# Patient Record
Sex: Male | Born: 1985 | Race: Black or African American | Hispanic: No | Marital: Single | Smoking: Never smoker
Health system: Southern US, Community
[De-identification: ages and names within clinical notes are randomized; demographics above are authoritative.]

## PROBLEM LIST (undated history)

## (undated) DIAGNOSIS — M1990 Unspecified osteoarthritis, unspecified site: Secondary | ICD-10-CM

---
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
## 2003-10-02 ENCOUNTER — Emergency Department (HOSPITAL_COMMUNITY): Admission: EM | Admit: 2003-10-02 | Discharge: 2003-10-02 | Payer: Self-pay | Admitting: Emergency Medicine

## 2003-10-08 ENCOUNTER — Emergency Department (HOSPITAL_COMMUNITY): Admission: EM | Admit: 2003-10-08 | Discharge: 2003-10-08 | Payer: Self-pay | Admitting: Emergency Medicine

## 2004-03-19 ENCOUNTER — Emergency Department (HOSPITAL_COMMUNITY): Admission: EM | Admit: 2004-03-19 | Discharge: 2004-03-19 | Payer: Self-pay | Admitting: *Deleted

## 2009-05-24 ENCOUNTER — Emergency Department (HOSPITAL_COMMUNITY): Admission: EM | Admit: 2009-05-24 | Discharge: 2009-05-24 | Payer: Self-pay | Admitting: Emergency Medicine

## 2010-05-02 ENCOUNTER — Inpatient Hospital Stay (INDEPENDENT_AMBULATORY_CARE_PROVIDER_SITE_OTHER)
Admission: RE | Admit: 2010-05-02 | Discharge: 2010-05-02 | Disposition: A | Discharge status: Home / Self Care | Payer: Self-pay | Source: Ambulatory Visit | Attending: Family Medicine | Admitting: Family Medicine

## 2010-05-02 DIAGNOSIS — J45909 Unspecified asthma, uncomplicated: Secondary | ICD-10-CM

## 2011-06-05 ENCOUNTER — Encounter (HOSPITAL_COMMUNITY): Payer: Self-pay | Admitting: Emergency Medicine

## 2011-06-05 ENCOUNTER — Emergency Department (HOSPITAL_COMMUNITY)
Admission: EM | Admit: 2011-06-05 | Discharge: 2011-06-05 | Disposition: A | Discharge status: Home / Self Care | Payer: BC Managed Care – PPO | Attending: Emergency Medicine | Admitting: Emergency Medicine

## 2011-06-05 ENCOUNTER — Emergency Department (HOSPITAL_COMMUNITY): Payer: BC Managed Care – PPO

## 2011-06-05 DIAGNOSIS — J45901 Unspecified asthma with (acute) exacerbation: Secondary | ICD-10-CM | POA: Insufficient documentation

## 2011-06-05 MED ORDER — PREDNISONE 20 MG PO TABS
60.0000 mg | ORAL_TABLET | Freq: Once | ORAL | Status: AC
  Administered 2011-06-05: 60 mg via ORAL
  Filled 2011-06-05: qty 3

## 2011-06-05 MED ORDER — ALBUTEROL SULFATE HFA 108 (90 BASE) MCG/ACT IN AERS
2.0000 | INHALATION_SPRAY | RESPIRATORY_TRACT | Status: DC
  Administered 2011-06-05: 2 via RESPIRATORY_TRACT
  Filled 2011-06-05: qty 6.7

## 2011-06-05 MED ORDER — PREDNISONE 10 MG PO TABS: 60.0000 mg | ORAL_TABLET | Freq: Every day | ORAL | Status: DC

## 2011-06-05 MED ORDER — ALBUTEROL SULFATE (5 MG/ML) 0.5% IN NEBU
5.0000 mg | INHALATION_SOLUTION | Freq: Once | RESPIRATORY_TRACT | Status: AC
  Administered 2011-06-05: 5 mg via RESPIRATORY_TRACT
  Filled 2011-06-05 (×2): qty 1

## 2011-06-05 MED ORDER — ALBUTEROL SULFATE HFA 108 (90 BASE) MCG/ACT IN AERS: 2.0000 | INHALATION_SPRAY | RESPIRATORY_TRACT | Status: DC | PRN

## 2011-06-05 NOTE — ED Notes (Signed)
Was lifting heavy things at work on Thursday, felt something pull in left chest area, left arm, asthma has been acting up since.

## 2011-06-05 NOTE — Discharge Instructions (Signed)
Asthma, Acute Bronchospasm  Your exam shows you have asthma, or acute bronchospasm that acts like asthma. Bronchospasm means your air passages become narrowed. These conditions are due to inflammation and airway spasm that cause narrowing of the bronchial tubes in the lungs. This causes you to have wheezing and shortness of breath.  CAUSES    Respiratory infections and allergies most often bring on these attacks. Smoking, air pollution, cold air, emotional upsets, and vigorous exercise can also bring them on.    TREATMENT     Treatment is aimed at making the narrowed airways larger. Mild asthma/bronchospasm is usually controlled with inhaled medicines. Albuterol is a common medicine that you breathe in to open spastic or narrowed airways. Some trade names for albuterol are Ventolin or Proventil. Steroid medicine is also used to reduce the inflammation when an attack is moderate or severe. Antibiotics (medications used to kill germs) are only used if a bacterial infection is present.    If you are pregnant and need to use Albuterol (Ventolin or Proventil), you can expect the baby to move more than usual shortly after the medicine is used.   HOME CARE INSTRUCTIONS     Rest.    Drink plenty of liquids. This helps the mucus to remain thin and easily coughed up. Do not use caffeine or alcohol.    Do not smoke. Avoid being exposed to second-hand smoke.    You play a critical role in keeping yourself in good health. Avoid exposure to things that cause you to wheeze. Avoid exposure to things that cause you to have breathing problems. Keep your medications up-to-date and available. Carefully follow your doctor's treatment plan.    When pollen or pollution is bad, keep windows closed and use an air conditioner go to places with air conditioning. If you are allergic to furry pets or birds, find new homes for them or keep them outside.    Take your medicine exactly as prescribed.     Asthma requires careful medical attention. See your caregiver for follow-up as advised. If you are more than [redacted] weeks pregnant and you were prescribed any new medications, let your Obstetrician know about the visit and how you are doing. Arrange a recheck.   SEEK IMMEDIATE MEDICAL CARE IF:     You are getting worse.    You have trouble breathing. If severe, call 911.    You develop chest pain or discomfort.    You are throwing up or not drinking fluids.    You are not getting better within 24 hours.    You are coughing up yellow, green, brown, or bloody sputum.    You develop a fever over 102 F (38.9 C).    You have trouble swallowing.   MAKE SURE YOU:     Understand these instructions.    Will watch your condition.    Will get help right away if you are not doing well or get worse.   Document Released: 05/01/2006 Document Revised: 01/03/2011 Document Reviewed: 12/29/2006  [employer] Patient Information 2012 [employer].
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
## 2018-05-13 ENCOUNTER — Other Ambulatory Visit: Payer: Self-pay

## 2018-05-13 ENCOUNTER — Emergency Department (HOSPITAL_COMMUNITY)
Admission: EM | Admit: 2018-05-13 | Discharge: 2018-05-13 | Disposition: A | Discharge status: Home / Self Care | Payer: Managed Care, Other (non HMO) | Attending: Emergency Medicine | Admitting: Emergency Medicine

## 2018-05-13 ENCOUNTER — Encounter (HOSPITAL_COMMUNITY): Payer: Self-pay | Admitting: Emergency Medicine

## 2018-05-13 DIAGNOSIS — J029 Acute pharyngitis, unspecified: Secondary | ICD-10-CM

## 2018-05-13 DIAGNOSIS — J45909 Unspecified asthma, uncomplicated: Secondary | ICD-10-CM | POA: Diagnosis not present

## 2018-05-13 DIAGNOSIS — Z79899 Other long term (current) drug therapy: Secondary | ICD-10-CM | POA: Insufficient documentation

## 2018-05-13 LAB — GROUP A STREP BY PCR: Group A Strep by PCR: NOT DETECTED

## 2018-05-13 MED ORDER — ACETAMINOPHEN 325 MG PO TABS
650.0000 mg | ORAL_TABLET | Freq: Once | ORAL | Status: AC
  Administered 2018-05-13: 650 mg via ORAL
  Filled 2018-05-13: qty 2

## 2018-05-13 MED ORDER — LIDOCAINE VISCOUS HCL 2 % MT SOLN
15.0000 mL | Freq: Once | OROMUCOSAL | Status: AC
  Administered 2018-05-13: 15 mL via OROMUCOSAL
  Filled 2018-05-13: qty 15

## 2018-05-13 MED ORDER — LIDOCAINE VISCOUS HCL 2 % MT SOLN: 15.0000 mL | OROMUCOSAL | 0 refills | Status: DC | PRN

## 2018-05-13 NOTE — Discharge Instructions (Signed)
Take viscous lidocaine as needed for sore throat.  Take over the counter tylenol as needed for headache and pain.  Return if you have any concerns. Your blood pressure is high today, please have it recheck by your doctor at your earliest convenient.

## 2018-05-13 NOTE — ED Provider Notes (Signed)
[facility]-EMERGENCY DEPT Provider Note   CSN: [id number] Arrival date & time: 05/13/18  8413    History   Chief Complaint Chief Complaint  Patient presents with  . Sore Throat    HPI Jason Owens is a 33 y.o. male.     The history is provided by the patient. No language interpreter was used.  Sore Throat      33 year old male with history of asthma presenting for evaluation of sore throat.  Patient report for the past 3 days he has had progressive worsening sore throat.  Rates pain as 8 out of 10, increasing pain with swallowing and also having a mild temporal headache along with pain in his neck.  He felt symptom started shortly after he was laughing while eating a piece of cake he may have choked on that.  He denies any associated fever or chills, no runny nose sneezing coughing chest pain or shortness of breath.  He denies any body aches.  He does report decrease in sensation of taste.  He denies any recent sick contact with anyone with COVID-19.  He did tried to drink tea, takes Halls, and cold medication without adequate relief.  Past Medical History:  Diagnosis Date  . Asthma     There are no active problems to display for this patient.   History reviewed. No pertinent surgical history.      Home Medications    Prior to Admission medications   Medication Sig Start Date End Date Taking? Authorizing Provider  albuterol (PROVENTIL HFA;VENTOLIN HFA) 108 (90 BASE) MCG/ACT inhaler Inhale 2 puffs into the lungs every 6 (six) hours as needed. For shortness of breath.    [provider]  albuterol (PROVENTIL HFA;VENTOLIN HFA) 108 (90 BASE) MCG/ACT inhaler Inhale 2 puffs into the lungs every 4 (four) hours as needed for wheezing. 06/05/11 06/04/12  Azalia Bilis, MD  FLOVENT HFA 110 MCG/ACT inhaler Inhale 1 puff into the lungs 2 (two) times daily. 05/04/18   [provider]  Fluticasone-Salmeterol (ADVAIR DISKUS IN) Inhale 1 puff into  the lungs 2 (two) times daily.    [provider]  predniSONE (DELTASONE) 10 MG tablet Take 6 tablets (60 mg total) by mouth daily. Patient not taking: Reported on 05/13/2018 06/05/11   Azalia Bilis, MD    Family History No family history on file.  Social History Social History   Tobacco Use  . Smoking status: Never Smoker  . Smokeless tobacco: Never Used  Substance Use Topics  . Alcohol use: Yes  . Drug use: Not on file     Allergies   Penicillins   Review of Systems Review of Systems  Constitutional: Negative for fever.  HENT: Positive for sore throat. Negative for trouble swallowing.      Physical Exam Updated Vital Signs BP (!) 151/93 (BP Location: Right Arm)   Pulse (!) 109   Temp 99 F (37.2 C) (Oral)   Resp 19   SpO2 97%   Physical Exam Vitals signs and nursing note reviewed.  Constitutional:      General: He is not in acute distress.    Appearance: He is well-developed.  HENT:     Head: Atraumatic.     Right Ear: Tympanic membrane normal.     Left Ear: Tympanic membrane normal.     Nose: No congestion or rhinorrhea.     Mouth/Throat:     Comments: Throat is difficult to examine however no obvious signs of peritonsillar abscess  and no trismus. Eyes:     Conjunctiva/sclera: Conjunctivae normal.  Neck:     Musculoskeletal: Normal range of motion and neck supple.     Thyroid: No thyromegaly.  Cardiovascular:     Rate and Rhythm: Normal rate.  Pulmonary:     Breath sounds: No wheezing.  Abdominal:     Palpations: Abdomen is soft.  Lymphadenopathy:     Cervical: No cervical adenopathy.  Skin:    Findings: No rash.  Neurological:     Mental Status: He is alert and oriented to person, place, and time.      ED Treatments / Results  Labs (all labs ordered are listed, but only abnormal results are displayed) Labs Reviewed  GROUP A STREP BY PCR    EKG None  Radiology No results found.  Procedures Procedures (including critical  care time)  Medications Ordered in ED Medications  acetaminophen (TYLENOL) tablet 650 mg (650 mg Oral Given 05/13/18 0959)  lidocaine (XYLOCAINE) 2 % viscous mouth solution 15 mL (15 mLs Mouth/Throat Given 05/13/18 0959)     Initial Impression / Assessment and Plan / ED Course  I have reviewed the triage vital signs and the nursing notes.  Pertinent labs & imaging results that were available during my care of the patient were reviewed by me and considered in my medical decision making (see chart for details).        BP (!) 151/93 (BP Location: Right Arm)   Pulse (!) 109   Temp 99 F (37.2 C) (Oral)   Resp 19   SpO2 97%    Final Clinical Impressions(s) / ED Diagnoses   Final diagnoses:  Sore throat    ED Discharge Orders         Ordered    lidocaine (XYLOCAINE) 2 % solution  As needed     05/13/18 0952         9:09 AM Patient here with sore throat.  This started shortly after he ate a piece of cake, and may have choked on that.  He is currently in no respiratory discomfort, able to speak in complete sentences, no hoarseness.  Throat examination is limited due to his large body habitus but no obvious signs of deep tissue infection on exam.  Neck is supple.  I also have low suspicion for COVID-19.  Will obtain rapid strep test to rule out bacterial infection.  Patient given Tylenol for headache and viscous lidocaine for throat irritation.  9:50 AM Pt's strep test negative.  Pt given viscous lidocaine for symptomatic treatment.  Low suspicion for esophageal stricture or obstruction.     Fayrene Helperran, Khush Pasion, PA-C 05/14/18 1512    Terrilee FilesButler, Michael C, MD 05/15/18 [phone]

## 2018-05-13 NOTE — ED Triage Notes (Signed)
Pt reports yesterday was eating cake and laughed and got choked on it. Reports had sore throat since. Pt adds headache

## 2018-05-13 NOTE — ED Notes (Signed)
ED Provider at bedside. 

## 2018-06-22 ENCOUNTER — Emergency Department (HOSPITAL_COMMUNITY): Payer: Managed Care, Other (non HMO)

## 2018-06-22 ENCOUNTER — Emergency Department (HOSPITAL_COMMUNITY)
Admission: EM | Admit: 2018-06-22 | Discharge: 2018-06-23 | Disposition: A | Discharge status: Home / Self Care | Payer: Managed Care, Other (non HMO) | Attending: Emergency Medicine | Admitting: Emergency Medicine

## 2018-06-22 ENCOUNTER — Other Ambulatory Visit: Payer: Self-pay

## 2018-06-22 ENCOUNTER — Encounter (HOSPITAL_COMMUNITY): Payer: Self-pay

## 2018-06-22 DIAGNOSIS — M25561 Pain in right knee: Secondary | ICD-10-CM | POA: Diagnosis present

## 2018-06-22 DIAGNOSIS — M25562 Pain in left knee: Secondary | ICD-10-CM

## 2018-06-22 DIAGNOSIS — Z79899 Other long term (current) drug therapy: Secondary | ICD-10-CM | POA: Insufficient documentation

## 2018-06-22 DIAGNOSIS — M170 Bilateral primary osteoarthritis of knee: Secondary | ICD-10-CM | POA: Insufficient documentation

## 2018-06-22 DIAGNOSIS — J45909 Unspecified asthma, uncomplicated: Secondary | ICD-10-CM | POA: Diagnosis not present

## 2018-06-22 MED ORDER — PREDNISONE 20 MG PO TABS: 20.0000 mg | ORAL_TABLET | Freq: Two times a day (BID) | ORAL | 0 refills | Status: DC

## 2018-06-22 MED ORDER — HYDROCODONE-ACETAMINOPHEN 5-325 MG PO TABS: 2.0000 | ORAL_TABLET | ORAL | 0 refills | Status: DC | PRN

## 2018-06-22 MED ORDER — HYDROCODONE-ACETAMINOPHEN 5-325 MG PO TABS
1.0000 | ORAL_TABLET | Freq: Once | ORAL | Status: AC
  Administered 2018-06-23: 1 via ORAL
  Filled 2018-06-22: qty 1

## 2018-06-22 MED ORDER — PREDNISONE 20 MG PO TABS
60.0000 mg | ORAL_TABLET | Freq: Once | ORAL | Status: AC
  Administered 2018-06-23: 60 mg via ORAL
  Filled 2018-06-22: qty 3

## 2018-06-22 NOTE — ED Notes (Signed)
Pt ambulatory.

## 2018-06-22 NOTE — ED Notes (Signed)
Bed: WTR5 Expected date:  Expected time:  Means of arrival:  Comments: 

## 2018-06-22 NOTE — ED Triage Notes (Signed)
Pt reports bilateral knee pain x2 weeks. Pt was given anti-inflammatory but reports it has not helped.

## 2018-06-22 NOTE — Discharge Instructions (Addendum)
The pain in your knees is likely due to arthritis.  To treat this discomfort we are prescribing prednisone and a pain reliever.  Do not take the narcotic pain reliever within 6 hours of driving or working.

## 2018-06-22 NOTE — ED Provider Notes (Signed)
[facility]-EMERGENCY DEPT Provider Note   CSN: [id number] Arrival date & time: 06/22/18  2104    History   Chief Complaint Chief Complaint  Patient presents with  . Knee Pain    HPI Jason Owens is a 33 y.o. male.     HPI   He presents for evaluation of right and left knee pain present for several weeks, without specific trauma.  He works a job at [employer], currently, and is on his feet.  His pain is worse when he attempts to get out of bed in the morning.  He tried an unknown type of anti-inflammatory medicine prescribed by Dr. from a clinic, 2 weeks ago.  He has had no relief from that medication.  No longstanding leg problems.  There are no other known modifying factors.  Past Medical History:  Diagnosis Date  . Asthma     There are no active problems to display for this patient.   History reviewed. No pertinent surgical history.      Home Medications    Prior to Admission medications   Medication Sig Start Date End Date Taking? Authorizing Provider  albuterol (PROVENTIL HFA;VENTOLIN HFA) 108 (90 BASE) MCG/ACT inhaler Inhale 2 puffs into the lungs every 6 (six) hours as needed. For shortness of breath.    [provider]  albuterol (PROVENTIL HFA;VENTOLIN HFA) 108 (90 BASE) MCG/ACT inhaler Inhale 2 puffs into the lungs every 4 (four) hours as needed for wheezing. 06/05/11 06/04/12  Azalia Bilis, MD  FLOVENT HFA 110 MCG/ACT inhaler Inhale 1 puff into the lungs 2 (two) times daily. 05/04/18   [provider]  Fluticasone-Salmeterol (ADVAIR DISKUS IN) Inhale 1 puff into the lungs 2 (two) times daily.    [provider]  HYDROcodone-acetaminophen (NORCO) 5-325 MG tablet Take 2 tablets by mouth every 4 (four) hours as needed for moderate pain. 06/22/18   Mancel Bale, MD  lidocaine (XYLOCAINE) 2 % solution Use as directed 15 mLs in the mouth or throat as needed for mouth pain (sore throat). 05/13/18   Fayrene Helper, PA-C   predniSONE (DELTASONE) 20 MG tablet Take 1 tablet (20 mg total) by mouth 2 (two) times daily. 06/22/18   Mancel Bale, MD    Family History No family history on file.  Social History Social History   Tobacco Use  . Smoking status: Never Smoker  . Smokeless tobacco: Never Used  Substance Use Topics  . Alcohol use: Yes  . Drug use: Not on file     Allergies   Penicillins   Review of Systems Review of Systems  All other systems reviewed and are negative.    Physical Exam Updated Vital Signs BP (!) 146/88 (BP Location: Left Arm)   Pulse 98   Temp 98.7 F (37.1 C) (Oral)   Resp 20   Ht 5\' 11"  (1.803 m)   Wt (!) 152 kg   SpO2 93%   BMI 46.74 kg/m   Physical Exam Vitals signs and nursing note reviewed.  Constitutional:      General: He is not in acute distress.    Appearance: He is well-developed. He is obese. He is not ill-appearing, toxic-appearing or diaphoretic.  HENT:     Head: Normocephalic and atraumatic.     Right Ear: External ear normal.     Left Ear: External ear normal.  Eyes:     Conjunctiva/sclera: Conjunctivae normal.     Pupils: Pupils are equal, round, and reactive to light.  Neck:     Musculoskeletal: Normal range of motion and neck supple.     Trachea: Phonation normal.  Cardiovascular:     Rate and Rhythm: Normal rate.  Pulmonary:     Effort: Pulmonary effort is normal.  Musculoskeletal: Normal range of motion.     Comments: Knees with normal range of motion bilaterally.  No areas of focalized tenderness.  Ambulation normal.  Skin:    General: Skin is warm and dry.  Neurological:     Mental Status: He is alert and oriented to person, place, and time.     Cranial Nerves: No cranial nerve deficit.     Sensory: No sensory deficit.     Motor: No abnormal muscle tone.     Coordination: Coordination normal.  Psychiatric:        Mood and Affect: Mood normal.        Behavior: Behavior normal.        Thought Content: Thought content  normal.        Judgment: Judgment normal.      ED Treatments / Results  Labs (all labs ordered are listed, but only abnormal results are displayed) Labs Reviewed - No data to display  EKG None  Radiology Dg Knee Complete 4 Views Left  Result Date: 06/22/2018 CLINICAL DATA:  Left knee pain for 2 weeks, no known injury, initial encounter EXAM: LEFT KNEE - COMPLETE 4+ VIEW COMPARISON:  None. FINDINGS: Mild degenerative changes are noted without acute fracture or dislocation. No soft tissue abnormality is noted. IMPRESSION: Degenerative changes without acute abnormality. Electronically Signed   By: Alcide CleverMark  Lukens M.D.   On: 06/22/2018 22:51   Dg Knee Complete 4 Views Right  Result Date: 06/22/2018 CLINICAL DATA:  Right knee pain, no known injury, initial encounter EXAM: RIGHT KNEE - COMPLETE 4+ VIEW COMPARISON:  None. FINDINGS: No acute fracture or dislocation is seen. Mild degenerative changes of the knee joint are noted. Small joint effusion is seen. IMPRESSION: Degenerative change with small joint effusion. Electronically Signed   By: Alcide CleverMark  Lukens M.D.   On: 06/22/2018 22:51    Procedures Procedures (including critical care time)  Medications Ordered in ED Medications  predniSONE (DELTASONE) tablet 60 mg (has no administration in time range)  HYDROcodone-acetaminophen (NORCO/VICODIN) 5-325 MG per tablet 1 tablet (has no administration in time range)     Initial Impression / Assessment and Plan / ED Course  I have reviewed the triage vital signs and the nursing notes.  Pertinent labs & imaging results that were available during my care of the patient were reviewed by me and considered in my medical decision making (see chart for details).         Patient Vitals for the past 24 hrs:  BP Temp Temp src Pulse Resp SpO2 Height Weight  06/22/18 2146 - - - - - - 5\' 11"  (1.803 m) (!) 152 kg  06/22/18 2138 (!) 146/88 98.7 F (37.1 C) Oral 98 20 93 % 5\' 11"  (1.803 m) (!) 152.4 kg     11:49 PM Reevaluation with update and discussion. After initial assessment and treatment, an updated evaluation reveals no change in clinical status, findings discussed with the patient and all questions were answered. Mancel BaleElliott Johnie Stadel   Medical Decision Making: Knee pain likely due to arthritis given by obesity.  Mild degenerative change on imaging.  Doubt internal derangement.  Doubt lumbar radiculopathy.  CRITICAL CARE-no Performed by: Mancel BaleElliott Thelia Tanksley  Nursing Notes Reviewed/ Care Coordinated Applicable Imaging Reviewed  Interpretation of Laboratory Data incorporated into ED treatment  The patient appears reasonably screened and/or stabilized for discharge and I doubt any other medical condition or other [facility] requiring further screening, evaluation, or treatment in the ED at this time prior to discharge.  Plan: Home Medications-continue routine medications; Home Treatments-heat and cryotherapy.; return here if the recommended treatment, does not improve the symptoms; Recommended follow up-orthopedic follow-up as needed.   Final Clinical Impressions(s) / ED Diagnoses   Final diagnoses:  Acute pain of both knees  Osteoarthritis of both knees, unspecified osteoarthritis type    ED Discharge Orders         Ordered    predniSONE (DELTASONE) 20 MG tablet  2 times daily     06/22/18 2348    HYDROcodone-acetaminophen (NORCO) 5-325 MG tablet  Every 4 hours PRN     06/22/18 2348           Mancel Bale, MD 06/22/18 2350

## 2018-06-23 NOTE — ED Notes (Signed)
Pt reports that he is not driving home. States that his ride is waiting outside.

## 2020-01-09 IMAGING — CR LEFT KNEE - COMPLETE 4+ VIEW
4 series · 4 of 4 positions shown · non-contrast
Comparison: None.

CLINICAL DATA: Left knee pain for 2 weeks, no known injury, initial
encounter

EXAM:
LEFT KNEE - COMPLETE 4+ VIEW

[t knee ap left]
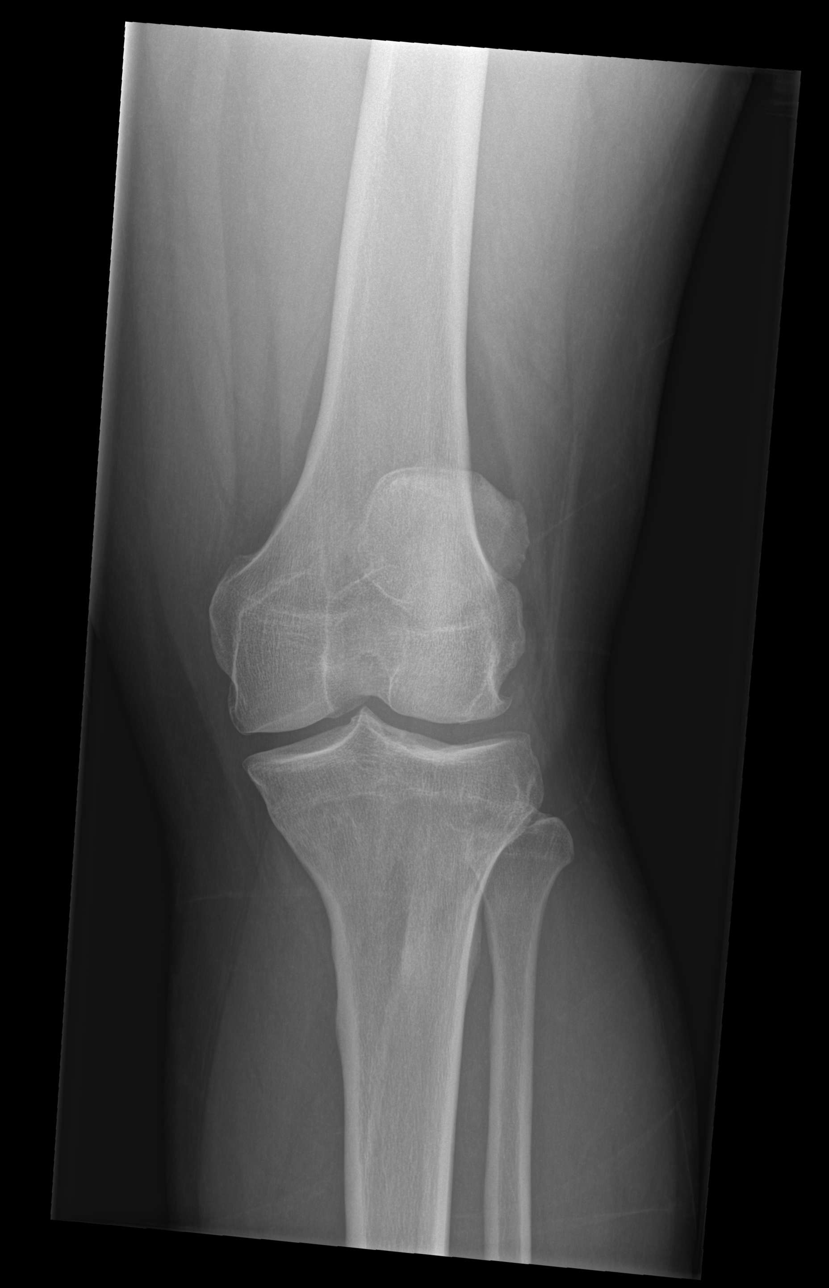

[t knee obl left (1 of 2)]
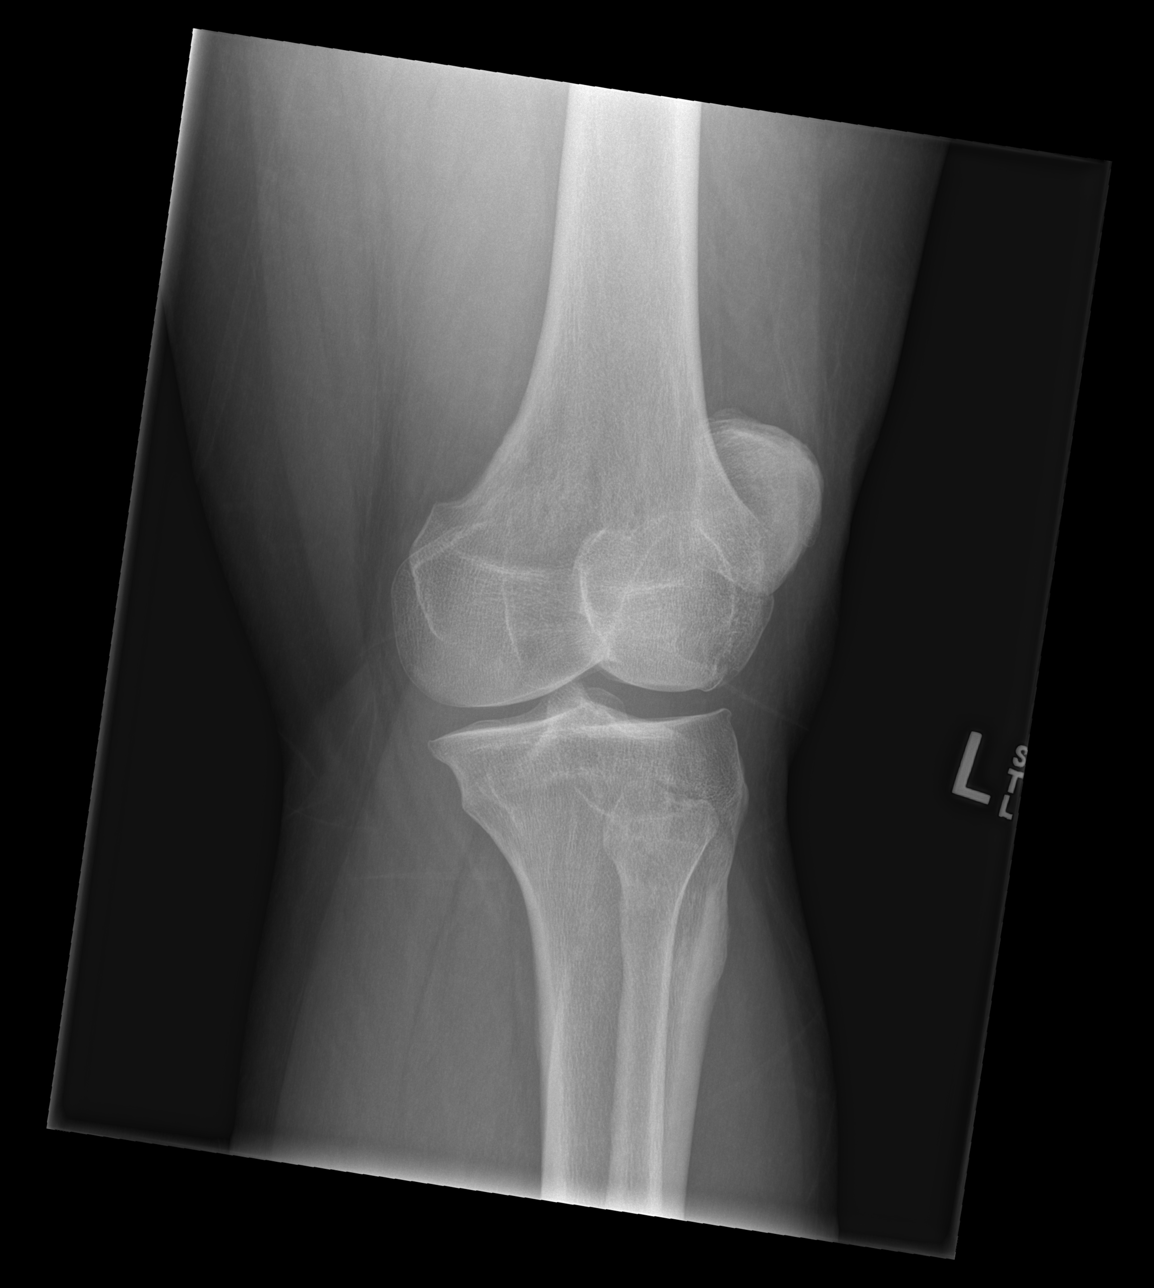

[t knee obl left (2 of 2)]
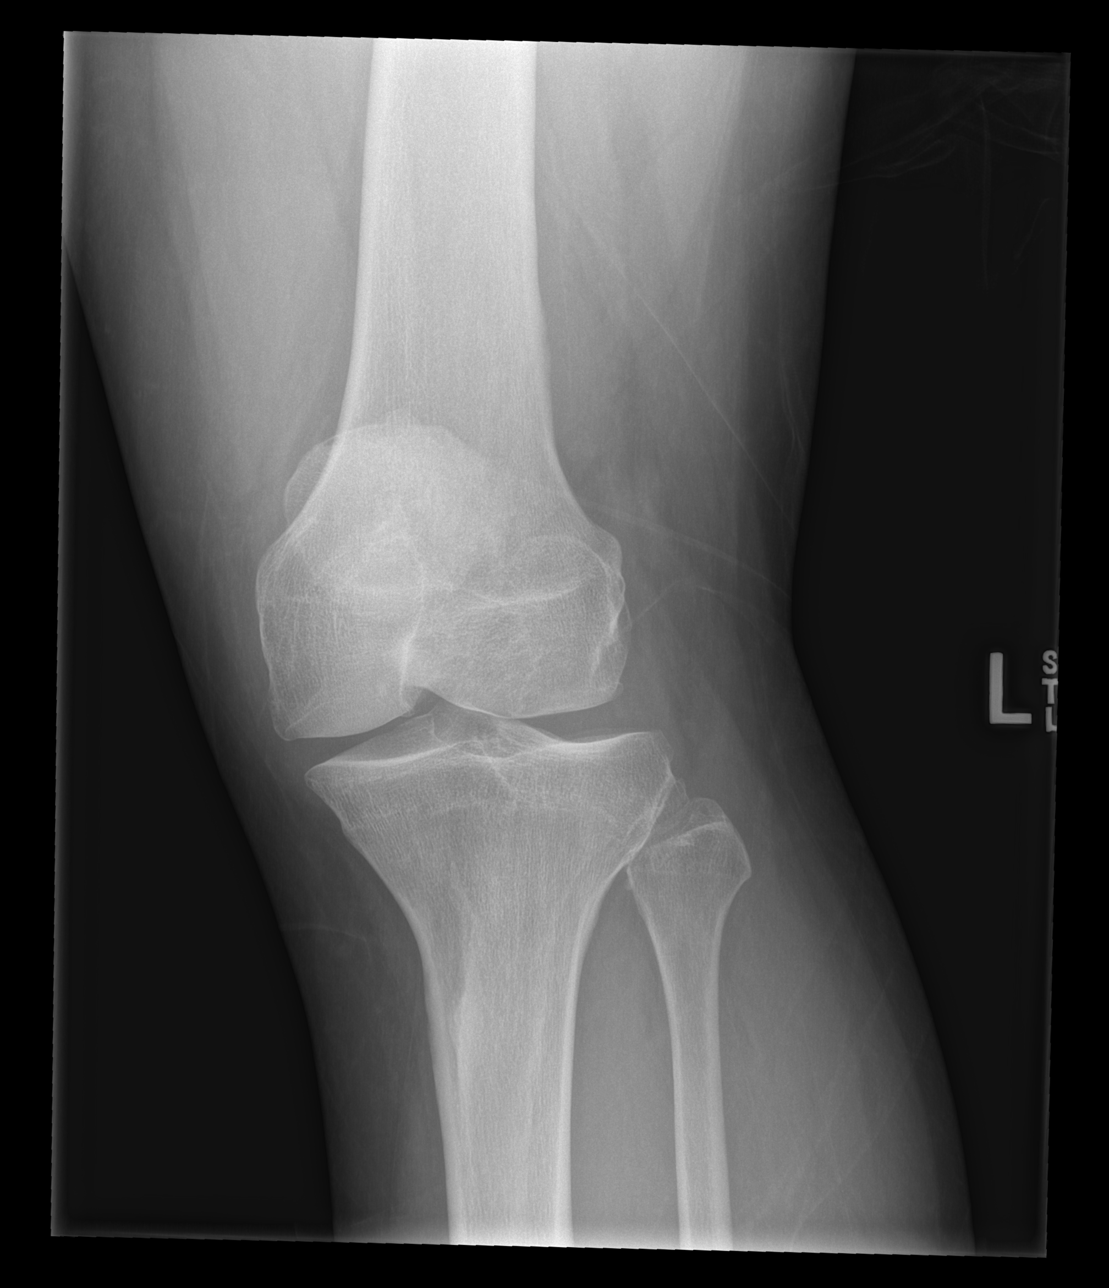

[t knee lat left]
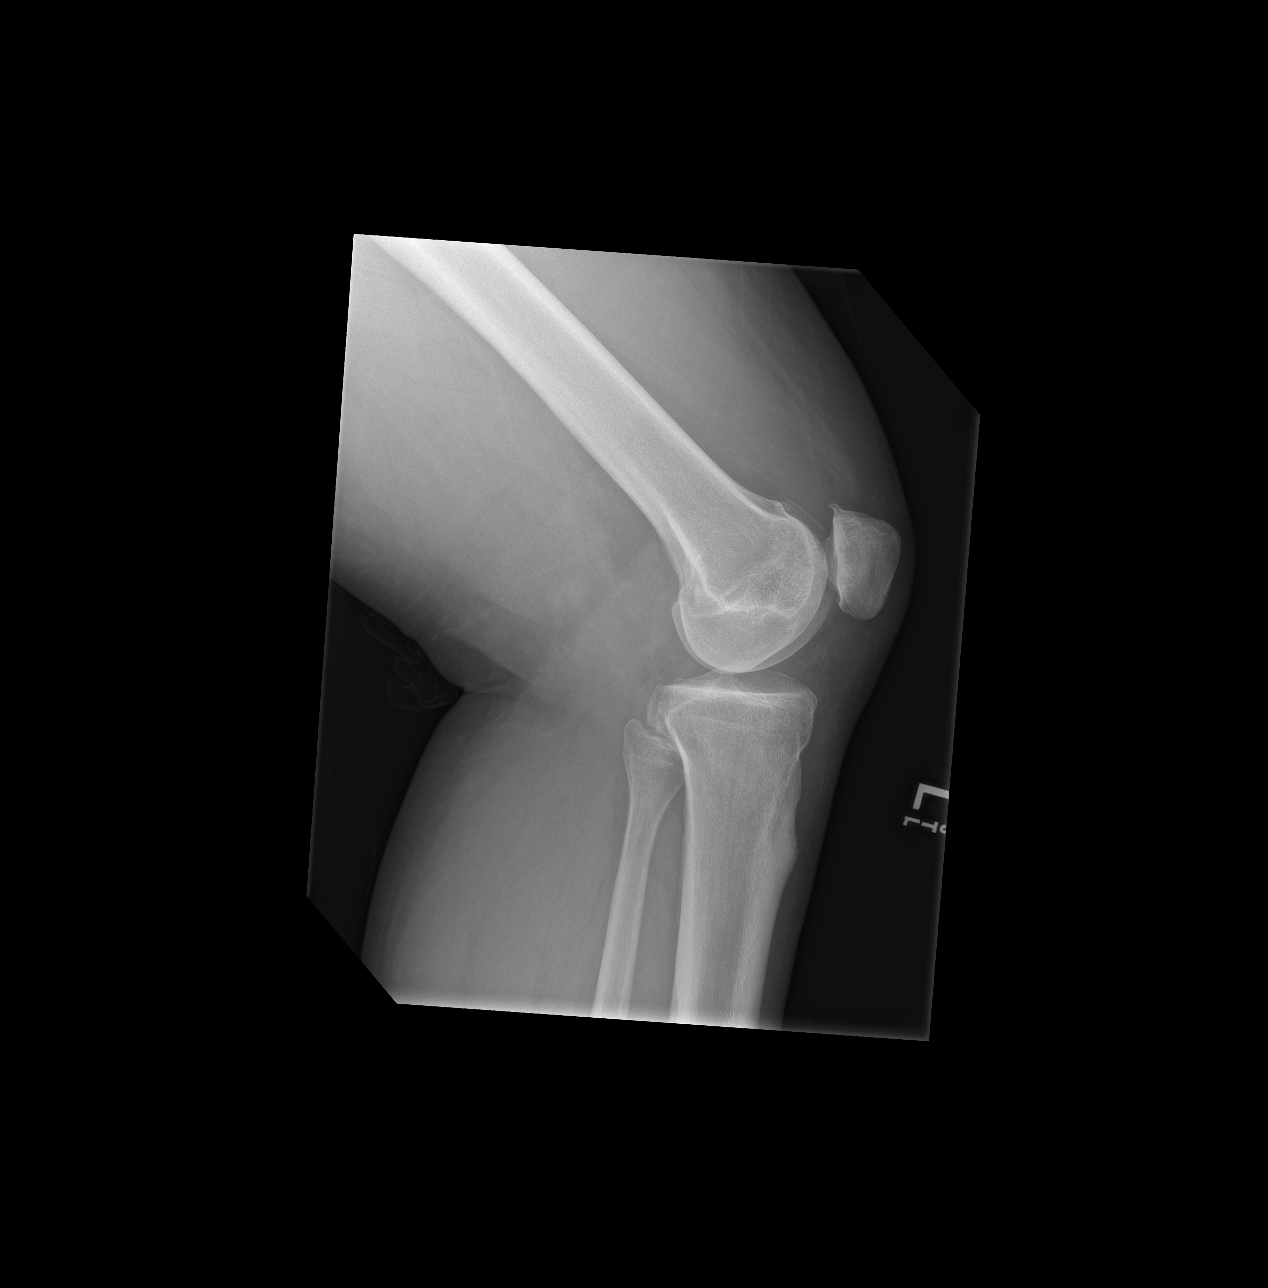

[4 of 4 positions shown; findings below may reference images not displayed]

FINDINGS: Mild degenerative changes are noted without acute fracture or
dislocation. No soft tissue abnormality is noted.
IMPRESSION: Degenerative changes without acute abnormality.

## 2020-01-09 IMAGING — CR RIGHT KNEE - COMPLETE 4+ VIEW
4 series · 4 of 4 positions shown · non-contrast
Comparison: None.

CLINICAL DATA: Right knee pain, no known injury, initial encounter

EXAM:
RIGHT KNEE - COMPLETE 4+ VIEW

[t knee ap right]
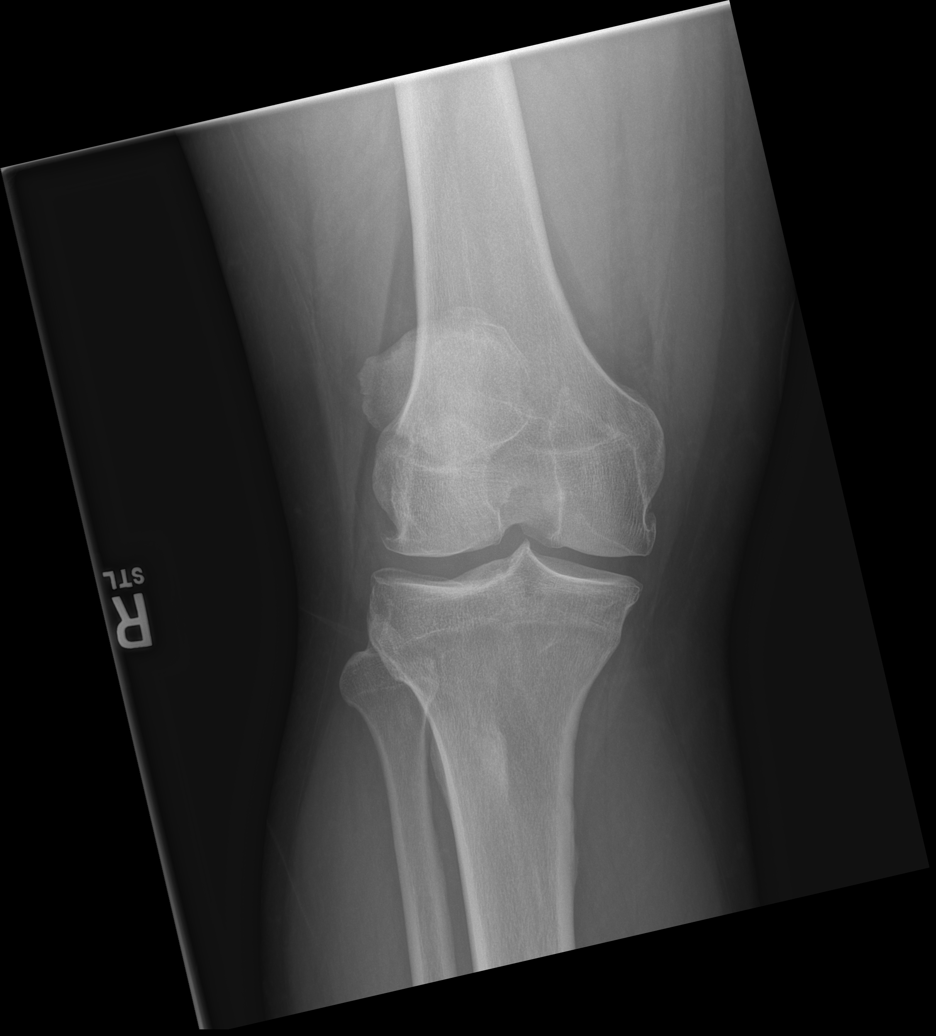

[t knee obl right (1 of 2)]
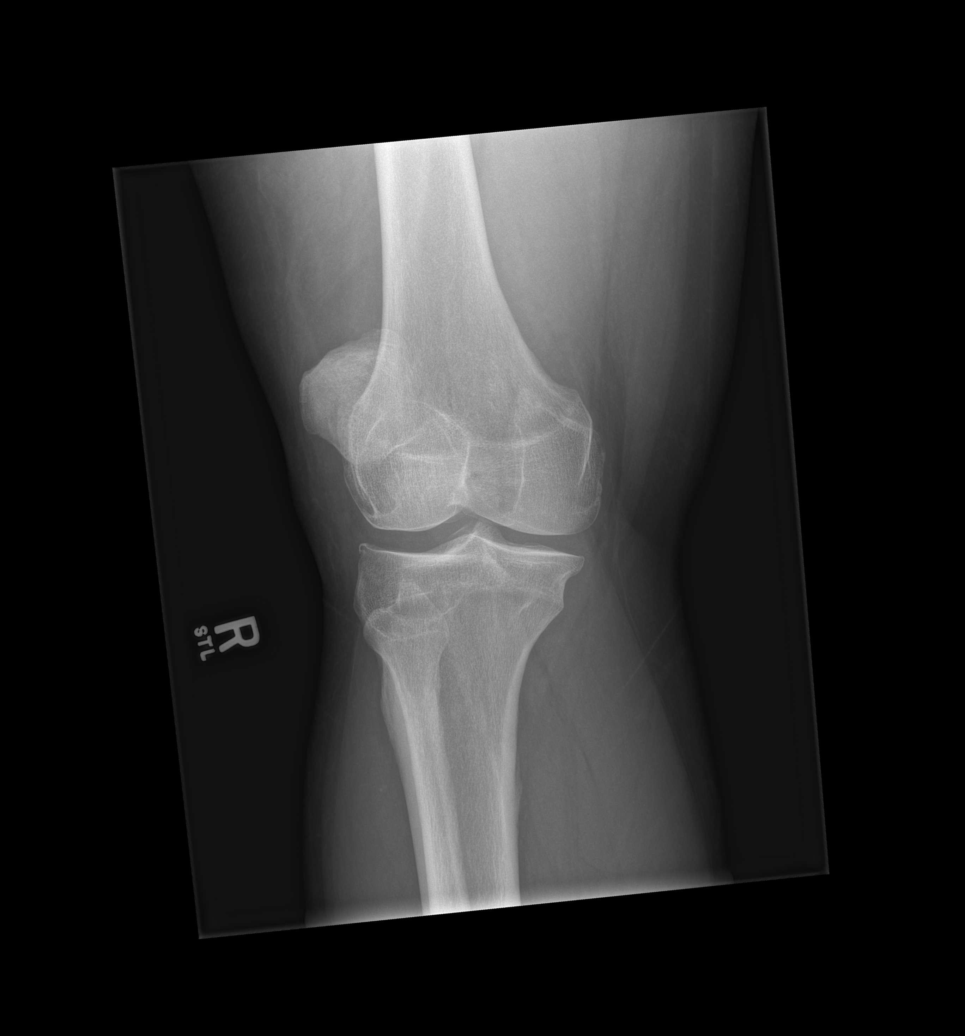

[t knee obl right (2 of 2)]
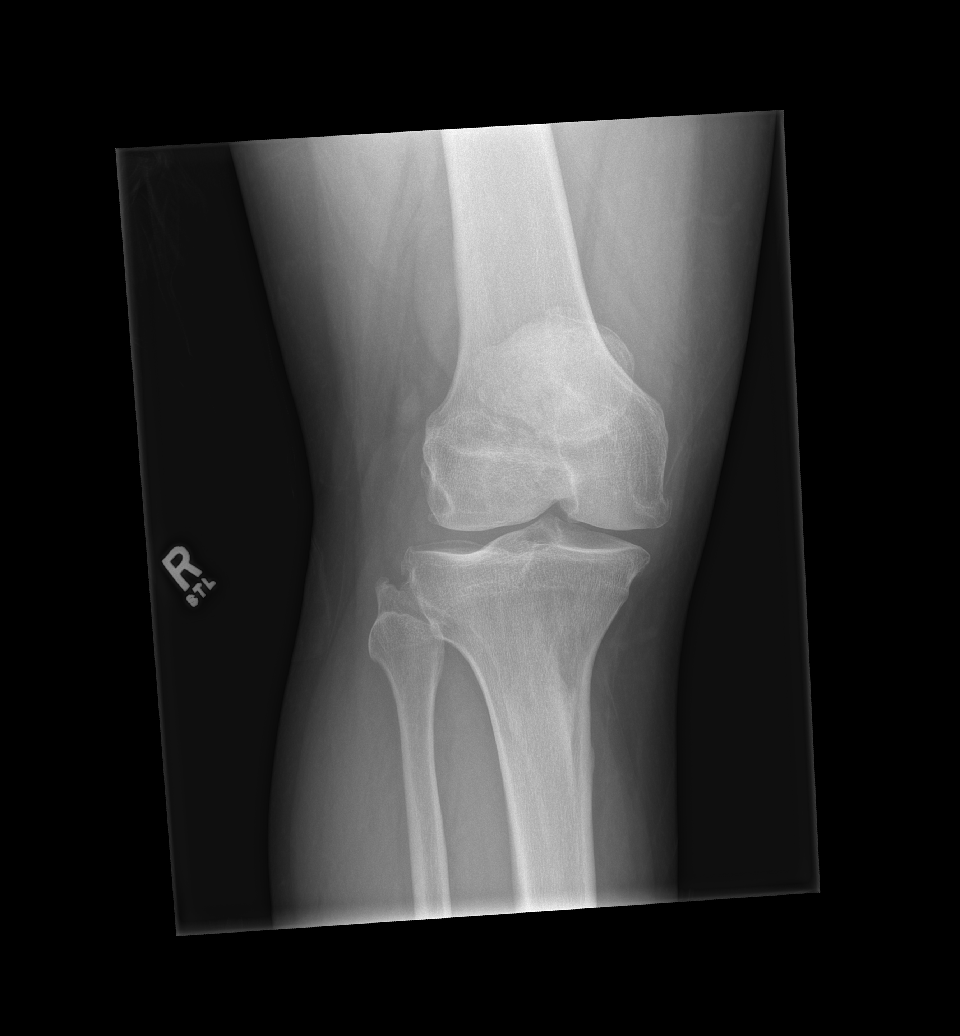

[t knee lat right]
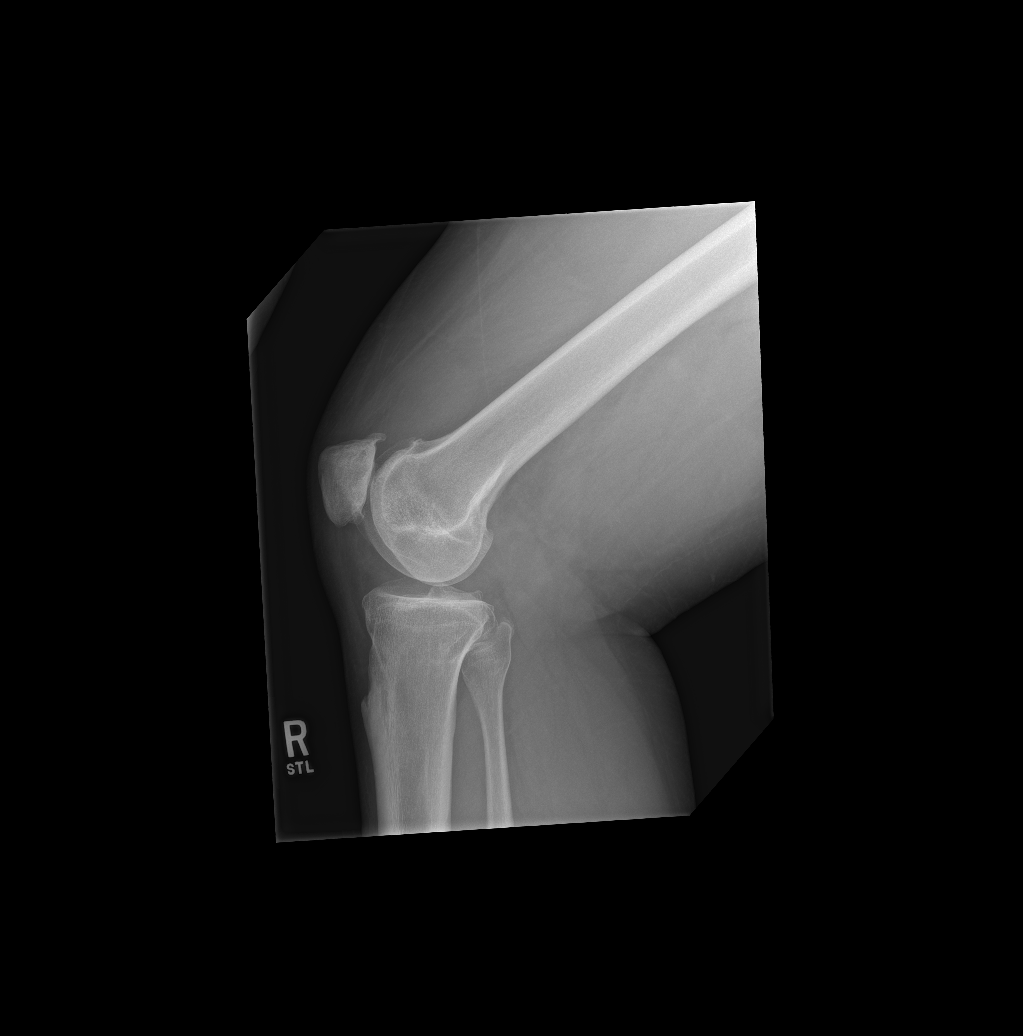

[4 of 4 positions shown; findings below may reference images not displayed]

FINDINGS: No acute fracture or dislocation is seen. Mild degenerative changes
of the knee joint are noted. Small joint effusion is seen.
IMPRESSION: Degenerative change with small joint effusion.

## 2020-01-19 DIAGNOSIS — M25561 Pain in right knee: Secondary | ICD-10-CM | POA: Diagnosis not present

## 2020-01-19 DIAGNOSIS — Z880 Allergy status to penicillin: Secondary | ICD-10-CM | POA: Diagnosis not present

## 2020-01-19 DIAGNOSIS — M25461 Effusion, right knee: Secondary | ICD-10-CM | POA: Diagnosis not present

## 2020-01-19 DIAGNOSIS — M1990 Unspecified osteoarthritis, unspecified site: Secondary | ICD-10-CM | POA: Diagnosis not present

## 2020-01-26 DIAGNOSIS — M25561 Pain in right knee: Secondary | ICD-10-CM | POA: Diagnosis not present

## 2020-01-26 DIAGNOSIS — Z23 Encounter for immunization: Secondary | ICD-10-CM | POA: Diagnosis not present

## 2020-02-15 DIAGNOSIS — Z87891 Personal history of nicotine dependence: Secondary | ICD-10-CM | POA: Diagnosis not present

## 2020-02-15 DIAGNOSIS — R918 Other nonspecific abnormal finding of lung field: Secondary | ICD-10-CM | POA: Diagnosis not present

## 2020-02-15 DIAGNOSIS — R059 Cough, unspecified: Secondary | ICD-10-CM | POA: Diagnosis not present

## 2020-02-15 DIAGNOSIS — R0602 Shortness of breath: Secondary | ICD-10-CM | POA: Diagnosis not present

## 2020-02-15 DIAGNOSIS — U071 COVID-19: Secondary | ICD-10-CM | POA: Diagnosis not present

## 2020-02-15 DIAGNOSIS — R110 Nausea: Secondary | ICD-10-CM | POA: Diagnosis not present

## 2020-02-15 DIAGNOSIS — J45909 Unspecified asthma, uncomplicated: Secondary | ICD-10-CM | POA: Diagnosis not present

## 2020-06-14 DIAGNOSIS — Z298 Encounter for other specified prophylactic measures: Secondary | ICD-10-CM | POA: Diagnosis not present

## 2020-06-14 DIAGNOSIS — Z412 Encounter for routine and ritual male circumcision: Secondary | ICD-10-CM | POA: Diagnosis not present

## 2020-07-22 DIAGNOSIS — Z6841 Body Mass Index (BMI) 40.0 and over, adult: Secondary | ICD-10-CM | POA: Diagnosis not present

## 2020-07-22 DIAGNOSIS — Z880 Allergy status to penicillin: Secondary | ICD-10-CM | POA: Diagnosis not present

## 2020-07-22 DIAGNOSIS — E669 Obesity, unspecified: Secondary | ICD-10-CM | POA: Diagnosis not present

## 2020-07-22 DIAGNOSIS — Z87891 Personal history of nicotine dependence: Secondary | ICD-10-CM | POA: Diagnosis not present

## 2020-07-22 DIAGNOSIS — N611 Abscess of the breast and nipple: Secondary | ICD-10-CM | POA: Diagnosis not present

## 2020-07-22 DIAGNOSIS — L538 Other specified erythematous conditions: Secondary | ICD-10-CM | POA: Diagnosis not present

## 2020-07-26 DIAGNOSIS — M1990 Unspecified osteoarthritis, unspecified site: Secondary | ICD-10-CM | POA: Diagnosis not present

## 2020-07-26 DIAGNOSIS — Z791 Long term (current) use of non-steroidal anti-inflammatories (NSAID): Secondary | ICD-10-CM | POA: Diagnosis not present

## 2020-07-26 DIAGNOSIS — Z87891 Personal history of nicotine dependence: Secondary | ICD-10-CM | POA: Diagnosis not present

## 2020-07-26 DIAGNOSIS — Z09 Encounter for follow-up examination after completed treatment for conditions other than malignant neoplasm: Secondary | ICD-10-CM | POA: Diagnosis not present

## 2020-07-26 DIAGNOSIS — Z880 Allergy status to penicillin: Secondary | ICD-10-CM | POA: Diagnosis not present

## 2020-07-26 DIAGNOSIS — Z79899 Other long term (current) drug therapy: Secondary | ICD-10-CM | POA: Diagnosis not present

## 2021-12-25 DIAGNOSIS — M25461 Effusion, right knee: Secondary | ICD-10-CM | POA: Diagnosis not present

## 2021-12-25 DIAGNOSIS — Z87891 Personal history of nicotine dependence: Secondary | ICD-10-CM | POA: Diagnosis not present

## 2021-12-25 DIAGNOSIS — M7989 Other specified soft tissue disorders: Secondary | ICD-10-CM | POA: Diagnosis not present

## 2021-12-25 DIAGNOSIS — Z880 Allergy status to penicillin: Secondary | ICD-10-CM | POA: Diagnosis not present

## 2021-12-25 DIAGNOSIS — M25562 Pain in left knee: Secondary | ICD-10-CM | POA: Diagnosis not present

## 2021-12-25 DIAGNOSIS — M1712 Unilateral primary osteoarthritis, left knee: Secondary | ICD-10-CM | POA: Diagnosis not present

## 2021-12-25 DIAGNOSIS — M25462 Effusion, left knee: Secondary | ICD-10-CM | POA: Diagnosis not present

## 2022-01-10 DIAGNOSIS — M2242 Chondromalacia patellae, left knee: Secondary | ICD-10-CM | POA: Diagnosis not present

## 2022-01-10 DIAGNOSIS — G8929 Other chronic pain: Secondary | ICD-10-CM | POA: Diagnosis not present

## 2022-01-10 DIAGNOSIS — M25562 Pain in left knee: Secondary | ICD-10-CM | POA: Diagnosis not present

## 2022-11-22 DIAGNOSIS — Z2989 Encounter for other specified prophylactic measures: Secondary | ICD-10-CM | POA: Diagnosis not present

## 2023-02-10 ENCOUNTER — Ambulatory Visit (HOSPITAL_COMMUNITY)
Admission: EM | Admit: 2023-02-10 | Discharge: 2023-02-10 | Disposition: A | Discharge status: Home / Self Care | Payer: 59 | Attending: Physician Assistant | Admitting: Physician Assistant

## 2023-02-10 ENCOUNTER — Inpatient Hospital Stay (HOSPITAL_COMMUNITY)
Admission: EM | Admit: 2023-02-10 | Discharge: 2023-02-13 | DRG: 189 | Disposition: A | Discharge status: Home / Self Care | Payer: 59 | Attending: Internal Medicine | Admitting: Internal Medicine

## 2023-02-10 ENCOUNTER — Encounter (HOSPITAL_COMMUNITY): Payer: Self-pay

## 2023-02-10 ENCOUNTER — Encounter (HOSPITAL_COMMUNITY): Payer: Self-pay | Admitting: Emergency Medicine

## 2023-02-10 ENCOUNTER — Other Ambulatory Visit: Payer: Self-pay

## 2023-02-10 ENCOUNTER — Emergency Department (HOSPITAL_COMMUNITY): Payer: Self-pay

## 2023-02-10 DIAGNOSIS — E875 Hyperkalemia: Secondary | ICD-10-CM | POA: Diagnosis not present

## 2023-02-10 DIAGNOSIS — J45901 Unspecified asthma with (acute) exacerbation: Principal | ICD-10-CM

## 2023-02-10 DIAGNOSIS — J9811 Atelectasis: Secondary | ICD-10-CM | POA: Diagnosis present

## 2023-02-10 DIAGNOSIS — Z6841 Body Mass Index (BMI) 40.0 and over, adult: Secondary | ICD-10-CM

## 2023-02-10 DIAGNOSIS — J9601 Acute respiratory failure with hypoxia: Secondary | ICD-10-CM

## 2023-02-10 DIAGNOSIS — E66813 Obesity, class 3: Secondary | ICD-10-CM | POA: Diagnosis not present

## 2023-02-10 DIAGNOSIS — T380X5A Adverse effect of glucocorticoids and synthetic analogues, initial encounter: Secondary | ICD-10-CM | POA: Diagnosis present

## 2023-02-10 DIAGNOSIS — J121 Respiratory syncytial virus pneumonia: Secondary | ICD-10-CM | POA: Diagnosis not present

## 2023-02-10 DIAGNOSIS — J4541 Moderate persistent asthma with (acute) exacerbation: Secondary | ICD-10-CM

## 2023-02-10 DIAGNOSIS — R079 Chest pain, unspecified: Secondary | ICD-10-CM | POA: Diagnosis not present

## 2023-02-10 DIAGNOSIS — B974 Respiratory syncytial virus as the cause of diseases classified elsewhere: Secondary | ICD-10-CM | POA: Diagnosis present

## 2023-02-10 DIAGNOSIS — G4733 Obstructive sleep apnea (adult) (pediatric): Secondary | ICD-10-CM | POA: Diagnosis not present

## 2023-02-10 DIAGNOSIS — Z23 Encounter for immunization: Secondary | ICD-10-CM | POA: Diagnosis not present

## 2023-02-10 DIAGNOSIS — Z7951 Long term (current) use of inhaled steroids: Secondary | ICD-10-CM

## 2023-02-10 DIAGNOSIS — R6510 Systemic inflammatory response syndrome (SIRS) of non-infectious origin without acute organ dysfunction: Secondary | ICD-10-CM | POA: Diagnosis not present

## 2023-02-10 DIAGNOSIS — R0789 Other chest pain: Secondary | ICD-10-CM | POA: Diagnosis not present

## 2023-02-10 DIAGNOSIS — R509 Fever, unspecified: Secondary | ICD-10-CM | POA: Diagnosis not present

## 2023-02-10 DIAGNOSIS — Z1152 Encounter for screening for COVID-19: Secondary | ICD-10-CM

## 2023-02-10 DIAGNOSIS — Z91199 Patient's noncompliance with other medical treatment and regimen due to unspecified reason: Secondary | ICD-10-CM

## 2023-02-10 DIAGNOSIS — R0902 Hypoxemia: Secondary | ICD-10-CM

## 2023-02-10 DIAGNOSIS — R0602 Shortness of breath: Secondary | ICD-10-CM | POA: Diagnosis not present

## 2023-02-10 HISTORY — DX: Unspecified osteoarthritis, unspecified site: M19.90

## 2023-02-10 LAB — CBC
HCT: 46.6 % (ref 39.0–52.0)
Hemoglobin: 15.3 g/dL (ref 13.0–17.0)
MCH: 29.8 pg (ref 26.0–34.0)
MCHC: 32.8 g/dL (ref 30.0–36.0)
MCV: 90.7 fL (ref 80.0–100.0)
Platelets: 255 10*3/uL (ref 150–400)
RBC: 5.14 MIL/uL (ref 4.22–5.81)
RDW: 13.2 % (ref 11.5–15.5)
WBC: 16.9 10*3/uL — ABNORMAL HIGH (ref 4.0–10.5)
nRBC: 0 % (ref 0.0–0.2)

## 2023-02-10 LAB — BASIC METABOLIC PANEL
Anion gap: 13 (ref 5–15)
BUN: 9 mg/dL (ref 6–20)
CO2: 24 mmol/L (ref 22–32)
Calcium: 9.7 mg/dL (ref 8.9–10.3)
Chloride: 99 mmol/L (ref 98–111)
Creatinine, Ser: 0.92 mg/dL (ref 0.61–1.24)
GFR, Estimated: >60 mL/min (ref >60)
Glucose, Bld: 120 mg/dL — ABNORMAL HIGH (ref 70–99)
Potassium: 4.5 mmol/L (ref 3.5–5.1)
Sodium: 136 mmol/L (ref 135–145)

## 2023-02-10 LAB — RESP PANEL BY RT-PCR (RSV, FLU A&B, COVID)  RVPGX2
Influenza A by PCR: NEGATIVE
Influenza B by PCR: NEGATIVE
Resp Syncytial Virus by PCR: POSITIVE — AB
SARS Coronavirus 2 by RT PCR: NEGATIVE

## 2023-02-10 LAB — POC COVID19/FLU A&B COMBO
Covid Antigen, POC: NEGATIVE
Influenza A Antigen, POC: NEGATIVE
Influenza B Antigen, POC: NEGATIVE

## 2023-02-10 LAB — TROPONIN I (HIGH SENSITIVITY): Troponin I (High Sensitivity): 7 ng/L (ref <18)

## 2023-02-10 MED ORDER — ACETAMINOPHEN 325 MG PO TABS
975.0000 mg | ORAL_TABLET | Freq: Once | ORAL | Status: AC
  Administered 2023-02-10: 975 mg via ORAL

## 2023-02-10 MED ORDER — MAGNESIUM SULFATE 2 GM/50ML IV SOLN
2.0000 g | Freq: Once | INTRAVENOUS | Status: AC
  Administered 2023-02-10: 2 g via INTRAVENOUS
  Filled 2023-02-10: qty 50

## 2023-02-10 MED ORDER — IPRATROPIUM-ALBUTEROL 0.5-2.5 (3) MG/3ML IN SOLN
RESPIRATORY_TRACT | Status: AC
  Filled 2023-02-10: qty 3

## 2023-02-10 MED ORDER — METHYLPREDNISOLONE SODIUM SUCC 125 MG IJ SOLR
INTRAMUSCULAR | Status: AC
  Filled 2023-02-10: qty 2

## 2023-02-10 MED ORDER — METHYLPREDNISOLONE SODIUM SUCC 125 MG IJ SOLR
60.0000 mg | Freq: Every day | INTRAMUSCULAR | Status: DC
  Administered 2023-02-11 – 2023-02-13 (×3): 60 mg via INTRAVENOUS
  Filled 2023-02-10 (×3): qty 2

## 2023-02-10 MED ORDER — METHYLPREDNISOLONE SODIUM SUCC 125 MG IJ SOLR
125.0000 mg | Freq: Once | INTRAMUSCULAR | Status: AC
  Administered 2023-02-10: 125 mg via INTRAVENOUS
  Filled 2023-02-10: qty 2

## 2023-02-10 MED ORDER — ENOXAPARIN SODIUM 40 MG/0.4ML IJ SOSY
40.0000 mg | PREFILLED_SYRINGE | Freq: Every day | INTRAMUSCULAR | Status: DC
  Administered 2023-02-11: 40 mg via SUBCUTANEOUS
  Filled 2023-02-10: qty 0.4

## 2023-02-10 MED ORDER — ALBUTEROL SULFATE (2.5 MG/3ML) 0.083% IN NEBU
2.5000 mg | INHALATION_SOLUTION | RESPIRATORY_TRACT | Status: DC | PRN
  Administered 2023-02-11 (×2): 2.5 mg via RESPIRATORY_TRACT
  Filled 2023-02-10 (×2): qty 3

## 2023-02-10 MED ORDER — ALBUTEROL SULFATE (2.5 MG/3ML) 0.083% IN NEBU
5.0000 mg | INHALATION_SOLUTION | Freq: Once | RESPIRATORY_TRACT | Status: AC
  Administered 2023-02-10: 5 mg via RESPIRATORY_TRACT
  Filled 2023-02-10: qty 6

## 2023-02-10 MED ORDER — METHYLPREDNISOLONE SODIUM SUCC 125 MG IJ SOLR
80.0000 mg | Freq: Once | INTRAMUSCULAR | Status: AC
  Administered 2023-02-10: 80 mg via INTRAMUSCULAR

## 2023-02-10 MED ORDER — ACETAMINOPHEN 325 MG PO TABS
ORAL_TABLET | ORAL | Status: AC
  Filled 2023-02-10: qty 3

## 2023-02-10 MED ORDER — IPRATROPIUM-ALBUTEROL 0.5-2.5 (3) MG/3ML IN SOLN
3.0000 mL | Freq: Once | RESPIRATORY_TRACT | Status: AC
  Administered 2023-02-10: 3 mL via RESPIRATORY_TRACT
  Filled 2023-02-10: qty 3

## 2023-02-10 MED ORDER — IPRATROPIUM-ALBUTEROL 0.5-2.5 (3) MG/3ML IN SOLN
3.0000 mL | Freq: Once | RESPIRATORY_TRACT | Status: AC
  Administered 2023-02-10: 3 mL via RESPIRATORY_TRACT

## 2023-02-10 NOTE — H&P (Signed)
 History and Physical    Patient: Jason Owens FMW:[id number] DOB: [birth year] DOA: 02/10/2023 DOS: the patient was seen and examined on 02/10/2023 PCP: Pcp, No  Patient coming from: Home  Chief Complaint:  Chief Complaint  Patient presents with   Shortness of Breath   Chest Pain   HPI: Jason Owens is a 38 y.o. male with medical history significant of asthma, morbid obesity, OSA noncompliant with CPAP who presents with cough and increasing shortness of breath.  Patient reports increasing chest tightness and shortness of breath about 4 days ago.  Felt congestion.  Took repeated doses of his albuterol  inhaler without relief.  Symptoms worsened today prompting him to come to the ED. He denies any tobacco or vaping use.    On arrival to the ED, he is febrile to 102.66F, normotensive and initially placed on 2 L due to oxygen desaturation to the mid 80s on room air.  He subsequently required high flow nasal cannula 10 L as he had desaturation down to 70% when asleep.  CBC with leukocytosis of 16K.  BMP unremarkable.  RSV is positive.  Chest x-ray negative.  Negative for flu and COVID otherwise.  He was administered multiple DuoNeb, IV magnesium  and Solu-Medrol  in the ED.  Hospitalist consulted for continued management of acute hypoxic respiratory failure secondary to asthma exacerbation from RSV.  Review of Systems: As mentioned in the history of present illness. All other systems reviewed and are negative. Past Medical History:  Diagnosis Date   Arthritis    Asthma    No past surgical history on file. Social History:  reports that he has never smoked. He has never used smokeless tobacco. He reports current alcohol use. No history on file for drug use.  Allergies  Allergen Reactions   Penicillins     Childhood allergy    No family history on file.  Prior to Admission medications   Medication Sig Start Date End Date Taking? Authorizing Provider  albuterol  (PROVENTIL   HFA;VENTOLIN  HFA) 108 (90 BASE) MCG/ACT inhaler Inhale 2 puffs into the lungs every 6 (six) hours as needed. For shortness of breath.    [provider]  albuterol  (PROVENTIL  HFA;VENTOLIN  HFA) 108 (90 BASE) MCG/ACT inhaler Inhale 2 puffs into the lungs every 4 (four) hours as needed for wheezing. 06/05/11 06/04/12  Baxter Drivers, MD  FLOVENT HFA 110 MCG/ACT inhaler Inhale 1 puff into the lungs 2 (two) times daily. Patient not taking: Reported on 02/10/2023 05/04/18   [provider]  Fluticasone-Salmeterol (ADVAIR DISKUS IN) Inhale 1 puff into the lungs 2 (two) times daily.    [provider]    Physical Exam: Vitals:   02/10/23 2111 02/10/23 2112 02/10/23 2113 02/10/23 2132  BP: (!) 160/66     Pulse: 98 100 98 (!) 105  Resp: 20 (!) 22 (!) 26 (!) 25  Temp:      TempSrc:      SpO2: (!) 88% 92% (!) 80% 97%  Weight:      Height:       Constitutional: NAD, calm, comfortable, non-toxic appearing morbidly obese male initially asleep and drowsy but became more awake and alert during evaluation Eyes: lids and conjunctivae normal ENMT: Mucous membranes are moist.  Neck: normal, supple Respiratory: diminished breath sounds throughout with rhonchi , no wheezing, no crackles. Normal respiratory effort. No accessory muscle use. On HFNC 10L. Able to speak in full sentences.  Cardiovascular: Regular rate and rhythm, no murmurs / rubs / gallops. No extremity  edema.  Abdomen: soft, non-tender, obese abdomen Musculoskeletal: no clubbing / cyanosis. No joint deformity upper and lower extremities.  Normal muscle tone.  Skin: no rashes, lesions, ulcers. No induration Neurologic: CN 2-12 grossly intact.  Psychiatric: Normal judgment and insight. Alert and oriented x 3. Normal mood.   Data Reviewed:  See HPI  Assessment and Plan: * Acute hypoxic respiratory failure (HCC) -secondary to Asthma exacerbation from RSV -Requiring high flow nasal cannula 10 L although mostly due to oxygen  desaturation in his sleep and he does have diagnosed OSA noncompliant with CPAP.  Continue to wean as tolerated with goal O2 of greater than 94% - Has received multiple DuoNebs, IV magnesium  and IV Solu-Medrol  in the ED -Continue IV solu medrol  daily -PRN albuterol  nebulizer   OSA (obstructive sleep apnea) -non-compliant with CPAP  Obesity, Class III, BMI 40-49.9 (morbid obesity) (HCC) -BMI of 46      Advance Care Planning: full  Consults: None  Family Communication: none at bedside  Severity of Illness: The appropriate patient status for this patient is INPATIENT. Inpatient status is judged to be reasonable and necessary in order to provide the required intensity of service to ensure the patient's safety. The patient's presenting symptoms, physical exam findings, and initial radiographic and laboratory data in the context of their chronic comorbidities is felt to place them at high risk for further clinical deterioration. Furthermore, it is not anticipated that the patient will be medically stable for discharge from the hospital within 2 midnights of admission.   * I certify that at the point of admission it is my clinical judgment that the patient will require inpatient hospital care spanning beyond 2 midnights from the point of admission due to high intensity of service, high risk for further deterioration and high frequency of surveillance required.*  Author: Alfrieda ONEIDA Pillar, DO 02/10/2023 11:39 PM  For on call review [email or web address].

## 2023-02-10 NOTE — Assessment & Plan Note (Signed)
-  non-compliant with CPAP

## 2023-02-10 NOTE — Assessment & Plan Note (Signed)
 BMI of 46 ?

## 2023-02-10 NOTE — ED Notes (Signed)
 Attempt to call Charge RN x2 with no answer.

## 2023-02-10 NOTE — ED Triage Notes (Signed)
 PT B[facility] from [facility] for complaints of SOB since Saturday. Pt states used albuterol inhaler 20 times. PT states he has not been on CPAP in almost a year. PT was given tylenol at  [facility] for temp of 102.1. Sats were in the mid 80s and placed on 2l [place].

## 2023-02-10 NOTE — ED Provider Notes (Signed)
 [facility]    CSN: [id number] Arrival date & time: 02/10/23  1551      History   Chief Complaint Chief Complaint  Patient presents with   Fever   Cough    HPI Jason Owens is a 38 y.o. male.   Patient presents today with a 2 to 3-day history of URI symptoms.  Reports congestion, cough, shortness of breath.  He does have a history of asthma that has required hospitalization but has never required intubation.  He was last hospitalized approximately 1 year ago.  He has been using his albuterol  inhaler without improvement of symptoms.  He does have a nebulizer at home but has not been using this regularly.  He does have Flovent and Advair listed on his chart but has not used these in several years.  He has been taking TheraFlu without improvement.  Denies any known sick contacts.  He has had COVID several years ago but not more recently.  Denies any recent antibiotics or steroids.  He denies any significant past medical history besides asthma.  He does smoke socially but does not smoke daily.  He is having difficulty with his daily activities and could not sleep last night as a result of the shortness of breath and cough.    Past Medical History:  Diagnosis Date   Asthma     There are no active problems to display for this patient.   History reviewed. No pertinent surgical history.     Home Medications    Prior to Admission medications   Medication Sig Start Date End Date Taking? Authorizing Provider  albuterol  (PROVENTIL  HFA;VENTOLIN  HFA) 108 (90 BASE) MCG/ACT inhaler Inhale 2 puffs into the lungs every 6 (six) hours as needed. For shortness of breath.    [provider]  albuterol  (PROVENTIL  HFA;VENTOLIN  HFA) 108 (90 BASE) MCG/ACT inhaler Inhale 2 puffs into the lungs every 4 (four) hours as needed for wheezing. 06/05/11 06/04/12  Baxter Drivers, MD  FLOVENT HFA 110 MCG/ACT inhaler Inhale 1 puff into the lungs 2 (two) times daily. Patient not taking:  Reported on 02/10/2023 05/04/18   [provider]  Fluticasone-Salmeterol (ADVAIR DISKUS IN) Inhale 1 puff into the lungs 2 (two) times daily.    [provider]    Family History History reviewed. No pertinent family history.  Social History Social History   Tobacco Use   Smoking status: Never   Smokeless tobacco: Never  Vaping Use   Vaping status: Never Used  Substance Use Topics   Alcohol use: Yes     Allergies   Penicillins   Review of Systems Review of Systems  Constitutional:  Positive for activity change, fatigue and fever. Negative for appetite change.  HENT:  Positive for congestion. Negative for sinus pressure, sneezing and sore throat.   Respiratory:  Positive for cough, chest tightness and shortness of breath. Negative for wheezing.   Cardiovascular:  Negative for chest pain.  Gastrointestinal:  Negative for abdominal pain, diarrhea, nausea and vomiting.  Neurological:  Negative for dizziness, light-headedness and headaches.     Physical Exam Triage Vital Signs ED Triage Vitals  Encounter Vitals Group     BP 02/10/23 1730 (!) 140/82     Systolic BP Percentile --      Diastolic BP Percentile --      Pulse Rate 02/10/23 1730 (!) 133     Resp 02/10/23 1730 18     Temp 02/10/23 1730 (!) 101.2 F (38.4 C)  Temp Source 02/10/23 1730 Oral     SpO2 02/10/23 1730 (!) 88 %     Weight --      Height --      Head Circumference --      Peak Flow --      Pain Score 02/10/23 1731 9     Pain Loc --      Pain Education --      Exclude from Growth Chart --    No data found.  Updated Vital Signs BP (!) 138/91 (BP Location: Left Arm)   Pulse (!) 125   Temp (!) 102.2 F (39 C) (Oral)   Resp 20   SpO2 90%   Visual Acuity Right Eye Distance:   Left Eye Distance:   Bilateral Distance:    Right Eye Near:   Left Eye Near:    Bilateral Near:     Physical Exam Vitals reviewed.  Constitutional:      General: He is awake.     Appearance:  Normal appearance. He is well-developed. He is not ill-appearing.     Comments: Very pleasant male appears stated age in no acute distress sitting comfortably in exam room  HENT:     Head: Normocephalic and atraumatic.     Right Ear: Tympanic membrane, ear canal and external ear normal. Tympanic membrane is not erythematous or bulging.     Left Ear: Tympanic membrane, ear canal and external ear normal. Tympanic membrane is not erythematous or bulging.     Nose: Nose normal.     Mouth/Throat:     Pharynx: Uvula midline. Postnasal drip present. No oropharyngeal exudate, posterior oropharyngeal erythema or uvula swelling.  Cardiovascular:     Rate and Rhythm: Regular rhythm. Tachycardia present.     Heart sounds: Normal heart sounds, S1 normal and S2 normal. No murmur heard. Pulmonary:     Effort: Pulmonary effort is normal. No accessory muscle usage or respiratory distress.     Breath sounds: No stridor. Decreased breath sounds and wheezing present. No rhonchi or rales.     Comments: Decreased aeration with scattered wheezing Neurological:     Mental Status: He is alert.  Psychiatric:        Behavior: Behavior is cooperative.      UC Treatments / Results  Labs (all labs ordered are listed, but only abnormal results are displayed) Labs Reviewed  POC COVID19/FLU A&B COMBO - Normal    EKG   Radiology No results found.  Procedures Procedures (including critical care time)  Medications Ordered in UC Medications  acetaminophen  (TYLENOL ) tablet 975 mg (975 mg Oral Given 02/10/23 1739)  ipratropium-albuterol  (DUONEB) 0.5-2.5 (3) MG/3ML nebulizer solution 3 mL (3 mLs Nebulization Given 02/10/23 1739)  methylPREDNISolone  sodium succinate (SOLU-MEDROL ) 125 mg/2 mL injection 80 mg (80 mg Intramuscular Given 02/10/23 1740)    Initial Impression / Assessment and Plan / UC Course  I have reviewed the triage vital signs and the nursing notes.  Pertinent labs & imaging results that were  available during my care of the patient were reviewed by me and considered in my medical decision making (see chart for details).     Patient was initially febrile, tachycardic, with oxygen saturation of 88%.  He was given DuoNeb and Solu-Medrol  in clinic but this did not provide significant improvement of symptoms.  Initially on recheck his oxygen saturation was in the low 80s but after he sat up and took several deep breaths we were able to get this up  to 90% but no higher.  I offered him a second breathing treatment but he reports that he had minimal improvement with the first and so believe that he would be better off going to the emergency room given his persistent symptoms.  COVID and flu testing were negative.  Chest x-ray was obtained but since he had no improvement with his medication and elected to go to the emergency room it was canceled as we expected to be repeated in the emergency room.  Given his significant shortness of breath with borderline hypoxia we did call [facility] for transport.  Final Clinical Impressions(s) / UC Diagnoses   Final diagnoses:  Shortness of breath  Acute respiratory failure with hypoxia (HCC)  Moderate persistent asthma with acute exacerbation  SIRS (systemic inflammatory response syndrome) (HCC)   Discharge Instructions   None    ED Prescriptions   None    PDMP not reviewed this encounter.   Sherrell Rocky POUR, PA-C 02/10/23 1829

## 2023-02-10 NOTE — ED Provider Notes (Addendum)
 [facility] Provider Note   CSN: [id number] Arrival date & time: 02/10/23  1911     History  Chief Complaint  Patient presents with   Shortness of Breath   Chest Pain    Jason Owens is a 38 y.o. male.  38 year old male with past medical history of asthma presenting to the emergency department today with shortness of breath and cough.  The patient states he has been feeling unwell since yesterday.  He states that he has been coughing up a lot of green sputum.  He has been having fevers at home.  He is found to be febrile urgent care.  He came to the ER today after he was found to be hypoxic at urgent care.  He was given a breathing treatment there.  He states he is feeling a little better after this.  He denies any chest pain but states that his chest was feeling tight earlier which has improved after the nebulizer treatment.  He came to the ER for further evaluation regarding this.   Shortness of Breath Associated symptoms: chest pain   Chest Pain Associated symptoms: shortness of breath        Home Medications Prior to Admission medications   Medication Sig Start Date End Date Taking? Authorizing Provider  albuterol  (PROVENTIL  HFA;VENTOLIN  HFA) 108 (90 BASE) MCG/ACT inhaler Inhale 2 puffs into the lungs every 6 (six) hours as needed. For shortness of breath.    [provider]  albuterol  (PROVENTIL  HFA;VENTOLIN  HFA) 108 (90 BASE) MCG/ACT inhaler Inhale 2 puffs into the lungs every 4 (four) hours as needed for wheezing. 06/05/11 06/04/12  Baxter Drivers, MD  FLOVENT HFA 110 MCG/ACT inhaler Inhale 1 puff into the lungs 2 (two) times daily. Patient not taking: Reported on 02/10/2023 05/04/18   [provider]  Fluticasone-Salmeterol (ADVAIR DISKUS IN) Inhale 1 puff into the lungs 2 (two) times daily.    [provider]      Allergies    Penicillins    Review of Systems   Review of Systems  Respiratory:   Positive for shortness of breath.   Cardiovascular:  Positive for chest pain.  All other systems reviewed and are negative.   Physical Exam Updated Vital Signs BP (!) 160/66   Pulse (!) 105   Temp 100.1 F (37.8 C) (Oral)   Resp (!) 25   Ht 5' 11 (1.803 m)   Wt (!) 152 kg   SpO2 97%   BMI 46.74 kg/m  Physical Exam Vitals and nursing note reviewed.   Gen: Mildly tachypneic, speaking in full sentences Eyes: PERRL, EOMI HEENT: no oropharyngeal swelling Neck: trachea midline Resp: Diminished with diffuse wheezes throughout all lung fields Card: RRR, no murmurs, rubs, or gallops Abd: nontender, nondistended Extremities: no calf tenderness, no edema Vascular: 2+ radial pulses bilaterally, 2+ DP pulses bilaterally Skin: no rashes Psyc: acting appropriately   ED Results / Procedures / Treatments   Labs (all labs ordered are listed, but only abnormal results are displayed) Labs Reviewed  RESP PANEL BY RT-PCR (RSV, FLU A&B, COVID)  RVPGX2 - Abnormal; Notable for the following components:      Result Value   Resp Syncytial Virus by PCR POSITIVE (*)    All other components within normal limits  BASIC METABOLIC PANEL - Abnormal; Notable for the following components:   Glucose, Bld 120 (*)    All other components within normal limits  CBC - Abnormal; Notable for  the following components:   WBC 16.9 (*)    All other components within normal limits  TROPONIN I (HIGH SENSITIVITY)  TROPONIN I (HIGH SENSITIVITY)    EKG None  Radiology DG Chest 2 View Result Date: 02/10/2023 CLINICAL DATA:  Chest pain with shortness of breath, cough and fever. EXAM: CHEST - 2 VIEW COMPARISON:  None Available. FINDINGS: The heart size and mediastinal contours are within normal limits. Mild linear atelectasis is suspected within the mid right lung. There is no evidence of focal consolidation, pleural effusion or pneumothorax. The visualized skeletal structures are unremarkable. IMPRESSION: Mild mid  right lung linear atelectasis. Electronically Signed   By: Suzen Dials M.D.   On: 02/10/2023 20:58    Procedures Procedures    Medications Ordered in ED Medications  albuterol  (PROVENTIL ) (2.5 MG/3ML) 0.083% nebulizer solution 5 mg (5 mg Nebulization Given 02/10/23 2010)  ipratropium-albuterol  (DUONEB) 0.5-2.5 (3) MG/3ML nebulizer solution 3 mL (3 mLs Nebulization Given 02/10/23 2101)  ipratropium-albuterol  (DUONEB) 0.5-2.5 (3) MG/3ML nebulizer solution 3 mL (3 mLs Nebulization Given 02/10/23 2101)  methylPREDNISolone  sodium succinate (SOLU-MEDROL ) 125 mg/2 mL injection 125 mg (125 mg Intravenous Given 02/10/23 2101)  magnesium  sulfate IVPB 2 g 50 mL (2 g Intravenous New Bag/Given 02/10/23 2106)    ED Course/ Medical Decision Making/ A&P                                 Medical Decision Making 38 year old male with past medical history of asthma presenting to the emergency department today with cough and shortness of breath.  The patient was hypoxic on arrival here.  I will further evaluate the patient here with basic labs as well as a COVID and flu swab.  Will obtain chest x-ray to evaluate for pulmonary edema, pulmonary infiltrates, or pneumothorax.  Will give the patient Solu-Medrol  as well as magnesium  and DuoNebs here and reevaluate for ultimate disposition.  The patient's x-ray is unremarkable.  Does have leukocytosis which is nonspecific.  RSV test is positive.  The patient is feeling a little bit better after the nebulizer treatments but was still requiring oxygen.  When he fell asleep he would desat down to the 70s but when he is awake and alert his pulse ox is in the mid to high 90s.  I suspect that there is at least a component of undiagnosed sleep apnea here.  He is placed on high flow nasal cannula with improvement.  I think that he is stable for admission and is able to mentate clearly and does not appear to be in any distress I think that he is appropriate for a floor bed or  stepdown.  CRITICAL CARE Performed by: Prentice JONELLE Medicus   Total critical care time: 38 minutes  Critical care time was exclusive of separately billable procedures and treating other patients.  Critical care was necessary to treat or prevent imminent or life-threatening deterioration.  Critical care was time spent personally by me on the following activities: development of treatment plan with patient and/or surrogate as well as nursing, discussions with consultants, evaluation of patient's response to treatment, examination of patient, obtaining history from patient or surrogate, ordering and performing treatments and interventions, ordering and review of laboratory studies, ordering and review of radiographic studies, pulse oximetry and re-evaluation of patient's condition.   Amount and/or Complexity of Data Reviewed Labs: ordered. Radiology: ordered.  Risk Prescription drug management. Decision regarding hospitalization.  Final Clinical Impression(s) / ED Diagnoses Final diagnoses:  Asthma with acute exacerbation, unspecified asthma severity, unspecified whether persistent  Hypoxia    Rx / DC Orders ED Discharge Orders     None         Ula Prentice SAUNDERS, MD 02/10/23 2225    Ula Prentice SAUNDERS, MD 02/10/23 2225

## 2023-02-10 NOTE — Assessment & Plan Note (Signed)
-  secondary to Asthma exacerbation from RSV -Requiring high flow nasal cannula 10 L although mostly due to oxygen desaturation in his sleep and he does have diagnosed OSA noncompliant with CPAP.  Continue to wean as tolerated with goal O2 of greater than 94% - Has received multiple DuoNebs, IV magnesium  and IV Solu-Medrol  in the ED -Continue IV solu medrol  daily -PRN albuterol  nebulizer

## 2023-02-10 NOTE — ED Notes (Signed)
[facility] notified for transport 

## 2023-02-10 NOTE — ED Triage Notes (Signed)
 Pt c/o cough, wheezing, and chest tightness since Saturday. States wheezing worse today. States SOB laying down. States using his inhaler with no relief.

## 2023-02-10 NOTE — ED Provider Triage Note (Signed)
 Emergency Medicine Provider Triage Evaluation Note  Jason Owens , a 38 y.o. male  was evaluated in triage.  Pt complains of cough, fever, wheezing, shortness of breath, history of asthma..  Review of Systems  Positive: Cough, wheezing, shortness of breath, fever, chest pain Negative: Leg swelling  Physical Exam  BP (!) 136/90 (BP Location: Right Arm)   Pulse (!) 119   Temp 100.1 F (37.8 C) (Oral)   Resp 20   Ht 5' 11 (1.803 m)   Wt (!) 152 kg   SpO2 94%   BMI 46.74 kg/m  Gen:   Awake, no distress    Resp:  Normal effort   MSK:   Moves extremities without difficulty   Other:     Medical Decision Making  Medically screening exam initiated at 7:35 PM.  Appropriate orders placed.  Jason Owens was informed that the remainder of the evaluation will be completed by another provider, this initial triage assessment does not replace that evaluation, and the importance of remaining in the ED until their evaluation is complete.  Patient has been hypoxic, requiring oxygen, and was given Solu-Medrol  at the urgent care where he came from.  Will order another nebulizer treatment and placed patient in room for further treatment.   Jason Hollering, MD 02/10/23 [phone]

## 2023-02-10 NOTE — ED Notes (Signed)
 Patient is being discharged from the Urgent Care and sent to the Emergency Department via [facility] . Per Rocky, [place], patient is in need of higher level of care due to Hypoxia. Patient is aware and verbalizes understanding of plan of care.  Vitals:   02/10/23 1730 02/10/23 1813  BP: (!) 140/82 (!) 138/91  Pulse: (!) 133 (!) 125  Resp: 18 20  Temp: (!) 101.2 F (38.4 C) (!) 102.2 F (39 C)  SpO2: (!) 88% 90%

## 2023-02-10 NOTE — Progress Notes (Signed)
 Placed pt on HFNC 10L pt is alert, oriented, answering and following commands . Pt did have a cpap in the past, but didn't wear it. when he falls asleep sats drop. At this time he sats are 105 HR and 97% on 10L

## 2023-02-11 ENCOUNTER — Encounter (HOSPITAL_COMMUNITY): Payer: Self-pay | Admitting: Family Medicine

## 2023-02-11 DIAGNOSIS — J9601 Acute respiratory failure with hypoxia: Secondary | ICD-10-CM | POA: Diagnosis not present

## 2023-02-11 LAB — BASIC METABOLIC PANEL
Anion gap: 10 (ref 5–15)
Anion gap: 11 (ref 5–15)
BUN: 12 mg/dL (ref 6–20)
BUN: 17 mg/dL (ref 6–20)
CO2: 26 mmol/L (ref 22–32)
CO2: 23 mmol/L (ref 22–32)
Calcium: 9.4 mg/dL (ref 8.9–10.3)
Calcium: 9.3 mg/dL (ref 8.9–10.3)
Chloride: 98 mmol/L (ref 98–111)
Chloride: 100 mmol/L (ref 98–111)
Creatinine, Ser: 1.01 mg/dL (ref 0.61–1.24)
Creatinine, Ser: 1.07 mg/dL (ref 0.61–1.24)
GFR, Estimated: >60 mL/min (ref >60)
GFR, Estimated: >60 mL/min (ref >60)
Glucose, Bld: 188 mg/dL — ABNORMAL HIGH (ref 70–99)
Glucose, Bld: 139 mg/dL — ABNORMAL HIGH (ref 70–99)
Potassium: 3.9 mmol/L (ref 3.5–5.1)
Potassium: 4.5 mmol/L (ref 3.5–5.1)
Sodium: 134 mmol/L — ABNORMAL LOW (ref 135–145)
Sodium: 134 mmol/L — ABNORMAL LOW (ref 135–145)

## 2023-02-11 LAB — BLOOD GAS, VENOUS
Acid-Base Excess: 5.1 mmol/L — ABNORMAL HIGH (ref 0.0–2.0)
Bicarbonate: 32.2 mmol/L — ABNORMAL HIGH (ref 20.0–28.0)
O2 Saturation: 94.3 %
Patient temperature: 37
pCO2, Ven: 57 mm[Hg] (ref 44–60)
pH, Ven: 7.36 (ref 7.25–7.43)
pO2, Ven: 68 mm[Hg] — ABNORMAL HIGH (ref 32–45)

## 2023-02-11 LAB — CBC
HCT: 47.2 % (ref 39.0–52.0)
Hemoglobin: 15.3 g/dL (ref 13.0–17.0)
MCH: 30 pg (ref 26.0–34.0)
MCHC: 32.4 g/dL (ref 30.0–36.0)
MCV: 92.5 fL (ref 80.0–100.0)
Platelets: 258 10*3/uL (ref 150–400)
RBC: 5.1 MIL/uL (ref 4.22–5.81)
RDW: 13.2 % (ref 11.5–15.5)
WBC: 17 10*3/uL — ABNORMAL HIGH (ref 4.0–10.5)
nRBC: 0 % (ref 0.0–0.2)

## 2023-02-11 LAB — HIV ANTIBODY (ROUTINE TESTING W REFLEX): HIV Screen 4th Generation wRfx: NONREACTIVE

## 2023-02-11 LAB — TROPONIN I (HIGH SENSITIVITY): Troponin I (High Sensitivity): 5 ng/L (ref <18)

## 2023-02-11 MED ORDER — ALBUTEROL SULFATE (2.5 MG/3ML) 0.083% IN NEBU
10.0000 mg/h | INHALATION_SOLUTION | RESPIRATORY_TRACT | Status: AC
Start: 2023-02-11 — End: 2023-02-11
  Administered 2023-02-11: 10 mg/h via RESPIRATORY_TRACT
  Filled 2023-02-11: qty 3

## 2023-02-11 MED ORDER — IPRATROPIUM-ALBUTEROL 0.5-2.5 (3) MG/3ML IN SOLN
3.0000 mL | Freq: Four times a day (QID) | RESPIRATORY_TRACT | Status: DC
  Administered 2023-02-11 – 2023-02-13 (×9): 3 mL via RESPIRATORY_TRACT
  Filled 2023-02-11 (×9): qty 3

## 2023-02-11 MED ORDER — BUDESONIDE 0.5 MG/2ML IN SUSP
0.5000 mg | Freq: Two times a day (BID) | RESPIRATORY_TRACT | Status: DC
Start: 2023-02-11 — End: 2023-02-13
  Administered 2023-02-11 – 2023-02-13 (×5): 0.5 mg via RESPIRATORY_TRACT
  Filled 2023-02-11 (×5): qty 2

## 2023-02-11 MED ORDER — ENOXAPARIN SODIUM 80 MG/0.8ML IJ SOSY
75.0000 mg | PREFILLED_SYRINGE | Freq: Every day | INTRAMUSCULAR | Status: DC
  Administered 2023-02-12 – 2023-02-13 (×2): 75 mg via SUBCUTANEOUS
  Filled 2023-02-11 (×2): qty 0.8

## 2023-02-11 NOTE — Progress Notes (Signed)
 PROGRESS NOTE    YAIDEN YANG  FMW:[id number] DOB: March 11, 1985 DOA: 02/10/2023 PCP: Freddrick, No    Brief Narrative:   TYKEEM LANZER is a 38 y.o. male with past medical history significant for asthma, morbid obesity, OSA noncompliant with home CPAP presents to [facility] ED on 1/13 with progressive shortness of breath and cough.  He endorses increased chest tightness associated with dyspnea approximately starting 4 days prior.  Also with congestion.  Took repeated doses of his albuterol  MDI inhaler without relief.  Given symptoms worsen prompted him to seek further care in the ED.  Denies tobacco or vaping use.  In the ED, temperature 102.2 F, HR 125, RR 20, BP 138/91, SpO2 70% on 2 L nasal cannula and placed on high flow nasal cannula 10 L.  VBG with pH 7.36, pCO2 57, pO2 68, bicarb 32.2.  Sodium 136, potassium 4.5, chloride 99, CO2 24, glucose 120, BUN 9, creatinine 0.92.  High sensitive troponin 7> 5.  WBC 16.9, hemoglobin 15.3, platelet count 255.  RSV positive.  COVID/influenza A/B PCR negative.  Chest x-ray with mild mid right lung linear atelectasis, no evidence of focal consolidation.  Patient was given multiple DuoNebs, IV magnesium , IV Solu-Medrol .  TRH consulted for admission for further evaluation and management of acute hypoxic respite failure secondary to asthma exacerbation complicated by acute RSV viral infection.  Assessment & Plan:   Acute hypoxic respiratory failure, POA Asthma exacerbation RSV viral infection Patient presenting to ED with 4-day history of progressive shortness of breath and cough despite use of albuterol  inhaler outpatient.  On arrival to the ED patient was found to be hypoxic with SpO2 74% on 2 L nasal cannula.  Despite oxygen, patient's oxygen demand increased requiring high flow nasal cannula followed by placement on BiPAP for increased work of breathing.  Received IV magnesium , IV steroids. -- Bipap transitioned back to HFNC 15L this am (continue BiPAP PRN) --  Solu-Medrol  60 mg IV every 24 hours -- Budesonide  neb twice daily -- DuoNeb every 6 hours scheduled -- Albuterol  neb every 4 hours as needed wheezing/shortness of breath -- Continue supplemental oxygen, maintain SpO2 greater than 92%  Morbid obesity, class III Body mass index is 46.74 kg/m.  Complicates all facets of care  History of OSA Noncompliant with CPAP outpatient.   DVT prophylaxis: enoxaparin  (LOVENOX ) injection 40 mg Start: 02/11/23 1000    Code Status: Full Code Family Communication: No family present at bedside this morning  Disposition Plan:  Level of care: Progressive Status is: Inpatient Remains inpatient appropriate because: Continues on BiPAP/high flow nasal cannula, needs weaning off of supplemental oxygen, IV steroids, scheduled neb treatments    Consultants:  None  Procedures:  None  Antimicrobials:  None   Subjective: Patient seen examined bedside, resting calmly.  Lying in bed.  Remains on BiPAP in ED holding area.  Reports dyspnea much improved and requesting BiPAP removal and eating breakfast morning.  No other specific questions, concerns or complaints at this time.  Denies headache, no dizziness, no visual disturbance, no chest pain, no palpitations, no abdominal pain, no current fever, no chills/night sweats, no nausea/vomit/diarrhea, no focal weakness, no fatigue, no paresthesia.  No acute events overnight per nursing staff.  Objective: Vitals:   02/11/23 0920 02/11/23 1000 02/11/23 1436 02/11/23 1715  BP:  132/69 136/65 (!) 138/95  Pulse:  (!) 125 (!) 112 (!) 107  Resp:  20 19 18   Temp:   98.2 F (36.8 C) 98 F (36.7 C)  TempSrc:   Oral Oral  SpO2: 97% 97% 98% 95%  Weight:      Height:        Intake/Output Summary (Last 24 hours) at 02/11/2023 1718 Last data filed at 02/10/2023 2206 Gross per 24 hour  Intake 50 ml  Output --  Net 50 ml   Filed Weights   02/10/23 1914  Weight: (!) 152 kg    Examination:  Physical Exam: GEN:  NAD, alert and oriented x 3, obese, chronically ill appearance, appears older than stated age HEENT: NCAT, PERRL, EOMI, sclera clear, MMM PULM: Diminished breath sounds bilateral bases with inspiratory/expiratory wheezing throughout all lung fields, normal respiratory effort without accessory muscle use, on BiPAP with FiO2 60% CV: RRR w/o M/G/R GI: abd soft, NTND, NABS, no R/G/M MSK: no peripheral edema, muscle strength globally intact 5/5 bilateral upper/lower extremities NEURO: CN II-XII intact, no focal deficits, sensation to light touch intact PSYCH: normal mood/affect Integumentary: No concerning rashes/lesions/wounds noted on exposed skin surfaces    Data Reviewed: I have personally reviewed following labs and imaging studies  CBC: Recent Labs  Lab 02/10/23 2028 02/11/23 0217  WBC 16.9* 17.0*  HGB 15.3 15.3  HCT 46.6 47.2  MCV 90.7 92.5  PLT 255 258   Basic Metabolic Panel: Recent Labs  Lab 02/10/23 2028 02/11/23 0217  NA 136 134*  K 4.5 3.9  CL 99 98  CO2 24 26  GLUCOSE 120* 188*  BUN 9 12  CREATININE 0.92 1.01  CALCIUM 9.7 9.4   GFR: Estimated Creatinine Clearance: 147.2 mL/min (by C-G formula based on SCr of 1.01 mg/dL). Liver Function Tests: No results for input(s): AST, ALT, ALKPHOS, BILITOT, PROT, ALBUMIN in the last 168 hours. No results for input(s): LIPASE, AMYLASE in the last 168 hours. No results for input(s): AMMONIA in the last 168 hours. Coagulation Profile: No results for input(s): INR, PROTIME in the last 168 hours. Cardiac Enzymes: No results for input(s): CKTOTAL, CKMB, CKMBINDEX, TROPONINI in the last 168 hours. BNP (last 3 results) No results for input(s): PROBNP in the last 8760 hours. HbA1C: No results for input(s): HGBA1C in the last 72 hours. CBG: No results for input(s): GLUCAP in the last 168 hours. Lipid Profile: No results for input(s): CHOL, HDL, LDLCALC, TRIG, CHOLHDL, LDLDIRECT  in the last 72 hours. Thyroid Function Tests: No results for input(s): TSH, T4TOTAL, FREET4, T3FREE, THYROIDAB in the last 72 hours. Anemia Panel: No results for input(s): VITAMINB12, FOLATE, FERRITIN, TIBC, IRON, RETICCTPCT in the last 72 hours. Sepsis Labs: No results for input(s): PROCALCITON, LATICACIDVEN in the last 168 hours.  Recent Results (from the past 240 hours)  Resp panel by RT-PCR (RSV, Flu A&B, Covid) Anterior Nasal Swab     Status: Abnormal   Collection Time: 02/10/23  7:37 PM   Specimen: Anterior Nasal Swab  Result Value Ref Range Status   SARS Coronavirus 2 by RT PCR NEGATIVE NEGATIVE Final   Influenza A by PCR NEGATIVE NEGATIVE Final   Influenza B by PCR NEGATIVE NEGATIVE Final    Comment: (NOTE) The Xpert Xpress SARS-CoV-2/FLU/RSV plus assay is intended as an aid in the diagnosis of influenza from Nasopharyngeal swab specimens and should not be used as a sole basis for treatment. Nasal washings and aspirates are unacceptable for Xpert Xpress SARS-CoV-2/FLU/RSV testing.  Fact Sheet for Patients: bloggercourse.com  Fact Sheet for Healthcare Providers: seriousbroker.it  This test is not yet approved or cleared by the [place]  FDA and has been authorized for detection  and/or diagnosis of SARS-CoV-2 by FDA under an Emergency Use Authorization (EUA). This EUA will remain in effect (meaning this test can be used) for the duration of the COVID-19 declaration under Section 564(b)(1) of the Act, 21 U.S.C. section 360bbb-3(b)(1), unless the authorization is terminated or revoked.     Resp Syncytial Virus by PCR POSITIVE (A) NEGATIVE Final    Comment: (NOTE) Fact Sheet for Patients: bloggercourse.com  Fact Sheet for Healthcare Providers: seriousbroker.it  This test is not yet approved or cleared by the [place]  FDA and has been  authorized for detection and/or diagnosis of SARS-CoV-2 by FDA under an Emergency Use Authorization (EUA). This EUA will remain in effect (meaning this test can be used) for the duration of the COVID-19 declaration under Section 564(b)(1) of the Act, 21 U.S.C. section 360bbb-3(b)(1), unless the authorization is terminated or revoked.  Performed at [facility], [address], [place], [place] [address]          Radiology Studies: DG Chest 2 View Result Date: 02/10/2023 CLINICAL DATA:  Chest pain with shortness of breath, cough and fever. EXAM: CHEST - 2 VIEW COMPARISON:  None Available. FINDINGS: The heart size and mediastinal contours are within normal limits. Mild linear atelectasis is suspected within the mid right lung. There is no evidence of focal consolidation, pleural effusion or pneumothorax. The visualized skeletal structures are unremarkable. IMPRESSION: Mild mid right lung linear atelectasis. Electronically Signed   By: Suzen Dials M.D.   On: 02/10/2023 20:58        Scheduled Meds:  budesonide  (PULMICORT ) nebulizer solution  0.5 mg Nebulization BID   enoxaparin  (LOVENOX ) injection  40 mg Subcutaneous Daily   ipratropium-albuterol   3 mL Nebulization Q6H   methylPREDNISolone  (SOLU-MEDROL ) injection  60 mg Intravenous Daily   Continuous Infusions:   LOS: 1 day    Critical Care Time Upon my evaluation, this patient had a high probability of imminent or life-threatening deterioration due to acute hypoxic respiratory failure secondary to severe asthma exacerbation requiring BiPAP, which required my direct attention, intervention, and personal management.  I have personally provided 46 minutes of critical care time exclusive of my time spent on separately billable procedures.  Time includes review of laboratory data, radiology results, discussion with consultants, and monitoring for potential decompensation.       Camellia PARAS Mickayla Trouten, DO [facility] Available via Epic secure chat 7am-7pm After these hours, please refer to coverage provider listed on [email or web address] 02/11/2023, 5:18 PM

## 2023-02-11 NOTE — Progress Notes (Signed)
   02/11/23 0639  Vent Select  $ Ventilator Initial/Subsequent  Initial  Invasive or Noninvasive Noninvasive  Adult Vent Y  Adult Ventilator Settings  Vent Type Servo i  Humidity HME  Vent Mode PCV;BIPAP  Set Rate 18 bmp  FiO2 (%) 40 %  I Time 0.9 Sec(s)  Pressure Control 10 cmH20  PEEP 5 cmH20  Adult Ventilator Measurements  Peak Airway Pressure 14 L/min  Resp Rate Spontaneous 8 br/min  Resp Rate Total 26 br/min  Exhaled Vt 1179 mL  Measured Ve 16.7 L  I:E Ratio Measured 1:1:2.5  Auto PEEP 0 cmH20  Total PEEP 5 cmH20  SpO2 97 %  Adult Ventilator Alarms  Alarms On Y  Ve High Alarm 40 L/min  Ve Low Alarm 5 L/min  Resp Rate High Alarm 50 br/min  Resp Rate Low Alarm 9  PEEP Low Alarm 2 cmH2O  Press High Alarm 30 cmH2O  T Apnea 20 sec(s)   Placed pt on per md order per increased WOB

## 2023-02-11 NOTE — ED Notes (Signed)
 Unable to get patient temp oral, patient on bypap.

## 2023-02-11 NOTE — ED Notes (Signed)
 Pt transitioned from BiPap to [place] per MD.

## 2023-02-11 NOTE — Plan of Care (Signed)

## 2023-02-11 NOTE — ED Notes (Signed)
 MD messaged about patient's current BP and work of breathing.

## 2023-02-12 DIAGNOSIS — J9601 Acute respiratory failure with hypoxia: Secondary | ICD-10-CM | POA: Diagnosis not present

## 2023-02-12 DIAGNOSIS — J45901 Unspecified asthma with (acute) exacerbation: Secondary | ICD-10-CM | POA: Diagnosis not present

## 2023-02-12 DIAGNOSIS — G4733 Obstructive sleep apnea (adult) (pediatric): Secondary | ICD-10-CM | POA: Diagnosis not present

## 2023-02-12 LAB — BASIC METABOLIC PANEL
Anion gap: 8 (ref 5–15)
BUN: 15 mg/dL (ref 6–20)
CO2: 32 mmol/L (ref 22–32)
Calcium: 9.3 mg/dL (ref 8.9–10.3)
Chloride: 98 mmol/L (ref 98–111)
Creatinine, Ser: 0.94 mg/dL (ref 0.61–1.24)
GFR, Estimated: >60 mL/min (ref >60)
Glucose, Bld: 123 mg/dL — ABNORMAL HIGH (ref 70–99)
Potassium: 5.3 mmol/L — ABNORMAL HIGH (ref 3.5–5.1)
Sodium: 138 mmol/L (ref 135–145)

## 2023-02-12 LAB — CBC
HCT: 46.8 % (ref 39.0–52.0)
Hemoglobin: 14.8 g/dL (ref 13.0–17.0)
MCH: 29.7 pg (ref 26.0–34.0)
MCHC: 31.6 g/dL (ref 30.0–36.0)
MCV: 94 fL (ref 80.0–100.0)
Platelets: 274 10*3/uL (ref 150–400)
RBC: 4.98 MIL/uL (ref 4.22–5.81)
RDW: 13.3 % (ref 11.5–15.5)
WBC: 25.3 10*3/uL — ABNORMAL HIGH (ref 4.0–10.5)
nRBC: 0 % (ref 0.0–0.2)

## 2023-02-12 LAB — PROCALCITONIN: Procalcitonin: 0.14 ng/mL

## 2023-02-12 LAB — BRAIN NATRIURETIC PEPTIDE: B Natriuretic Peptide: 26.5 pg/mL (ref 0.0–100.0)

## 2023-02-12 LAB — MAGNESIUM: Magnesium: 2.6 mg/dL — ABNORMAL HIGH (ref 1.7–2.4)

## 2023-02-12 MED ORDER — INFLUENZA VIRUS VACC SPLIT PF (FLUZONE) 0.5 ML IM SUSY
0.5000 mL | PREFILLED_SYRINGE | INTRAMUSCULAR | Status: AC
Start: 2023-02-13 — End: 2023-02-13
  Administered 2023-02-13: 0.5 mL via INTRAMUSCULAR
  Filled 2023-02-12: qty 0.5

## 2023-02-12 MED ORDER — ACETAMINOPHEN 325 MG PO TABS: 650.0000 mg | ORAL_TABLET | Freq: Four times a day (QID) | ORAL | Status: DC | PRN

## 2023-02-12 MED ORDER — SODIUM ZIRCONIUM CYCLOSILICATE 5 G PO PACK
5.0000 g | PACK | Freq: Every day | ORAL | Status: AC
Start: 2023-02-12 — End: 2023-02-12
  Administered 2023-02-12: 5 g via ORAL
  Filled 2023-02-12: qty 1

## 2023-02-12 MED ORDER — ONDANSETRON HCL 4 MG/2ML IJ SOLN: 4.0000 mg | Freq: Four times a day (QID) | INTRAMUSCULAR | Status: DC | PRN

## 2023-02-12 NOTE — Plan of Care (Signed)

## 2023-02-12 NOTE — Progress Notes (Signed)
 PROGRESS NOTE        PATIENT DETAILS Name: Jason Owens Age: 38 y.o. Sex: male Date of Birth: 1985/09/16 Admit Date: 02/10/2023 Admitting Physician Charmaine Coop, DO PCP:Pcp, No  Brief Summary: Patient is a 38 y.o.  male with history of asthma, morbid obesity, OSA-noncompliant with CPAP-presented with shortness of breath-found to have acute hypoxic respiratory failure secondary to asthma exacerbation in the setting of RSV infection.  Significant events: 1/13>> admit to [facility]-SOB-hypoxia-on BiPAP/HFNC.  Significant studies: 1/13>> CXR: Mid right lung atelectasis.  Significant microbiology data: 1/13>> COVID/influenza PCR: Negative 1/13>> RSV PCR: Positive  Procedures: None  Consults: None  Subjective: Feels better-liberated off CPAP this morning-on 6 L.  Still wheezing.  Objective: Vitals: Blood pressure (!) 144/90, pulse 90, temperature (!) 97.4 F (36.3 C), temperature source Axillary, resp. rate 17, height 5\' 11"  (1.803 m), weight (!) 152 kg, SpO2 95%.   Exam: Gen Exam:Alert awake-not in any distress HEENT:atraumatic, normocephalic Chest: Moving air well-still with expiratory rhonchi. CVS:S1S2 regular Abdomen:soft non tender, non distended Extremities:no edema Neurology: Non focal Skin: no rash  Pertinent Labs/Radiology:    Latest Ref Rng & Units 02/12/2023    5:04 AM 02/11/2023    2:17 AM 02/10/2023    8:28 PM  CBC  WBC 4.0 - 10.5 K/uL 25.3  17.0  16.9   Hemoglobin 13.0 - 17.0 g/dL 40.9  81.1  91.4   Hematocrit 39.0 - 52.0 % 46.8  47.2  46.6   Platelets 150 - 400 K/uL 274  258  255     Lab Results  Component Value Date   NA 138 02/12/2023   K 5.3 (H) 02/12/2023   CL 98 02/12/2023   CO2 32 02/12/2023    Assessment/Plan: Acute hypoxic respiratory failure Secondary to asthma exacerbation/RSV infection Improving-less oxygen requirement-down to 6 L of HFNC Continue to titrate down FiO2  Asthma exacerbation secondary to RSV  infection Still wheezing but moving air well Steroids/bronchodilators Mobilize with nursing staff Incentive spirometry/flutter valve  OSA Noncompliant to CPAP previously CPAP during this hospitalization Case manager evaluation prior to this hospitalization  Hyperkalemia Lokelma  x 1-repeat electrolytes tomorrow  Leukocytosis Likely due to steroids  Morbid Obesity: Estimated body mass index is 46.74 kg/m as calculated from the following:   Height as of this encounter: 5\' 11"  (1.803 m).   Weight as of this encounter: 152 kg.   Code status:   Code Status: Full Code   DVT Prophylaxis: SQ Lovenox    Family Communication: None at bedside   Disposition Plan: Status is: Inpatient Remains inpatient appropriate because: Severity of illness   Planned Discharge Destination:Home   Diet: Diet Order             Diet Heart Room service appropriate? Yes; Fluid consistency: Thin  Diet effective now                     Antimicrobial agents: Anti-infectives (From admission, onward)    None        MEDICATIONS: Scheduled Meds:  budesonide  (PULMICORT ) nebulizer solution  0.5 mg Nebulization BID   enoxaparin  (LOVENOX ) injection  75 mg Subcutaneous Daily   ipratropium-albuterol   3 mL Nebulization Q6H   methylPREDNISolone  (SOLU-MEDROL ) injection  60 mg Intravenous Daily   Continuous Infusions: PRN Meds:.acetaminophen , albuterol , ondansetron  (ZOFRAN ) IV   I have personally reviewed following labs  and imaging studies  LABORATORY DATA: CBC: Recent Labs  Lab 02/10/23 2028 02/11/23 0217 02/12/23 0504  WBC 16.9* 17.0* 25.3*  HGB 15.3 15.3 14.8  HCT 46.6 47.2 46.8  MCV 90.7 92.5 94.0  PLT 255 258 274    Basic Metabolic Panel: Recent Labs  Lab 02/10/23 2028 02/11/23 0217 02/11/23 1748 02/12/23 0504 02/12/23 0835  NA 136 134* 134*  --  138  K 4.5 3.9 4.5  --  5.3*  CL 99 98 100  --  98  CO2 24 26 23   --  32  GLUCOSE 120* 188* 139*  --  123*  BUN 9 12  17   --  15  CREATININE 0.92 1.01 1.07  --  0.94  CALCIUM 9.7 9.4 9.3  --  9.3  MG  --   --   --  2.6*  --     GFR: Estimated Creatinine Clearance: 158.2 mL/min (by C-G formula based on SCr of 0.94 mg/dL).  Liver Function Tests: No results for input(s): "AST", "ALT", "ALKPHOS", "BILITOT", "PROT", "ALBUMIN" in the last 168 hours. No results for input(s): "LIPASE", "AMYLASE" in the last 168 hours. No results for input(s): "AMMONIA" in the last 168 hours.  Coagulation Profile: No results for input(s): "INR", "PROTIME" in the last 168 hours.  Cardiac Enzymes: No results for input(s): "CKTOTAL", "CKMB", "CKMBINDEX", "TROPONINI" in the last 168 hours.  BNP (last 3 results) No results for input(s): "PROBNP" in the last 8760 hours.  Lipid Profile: No results for input(s): "CHOL", "HDL", "LDLCALC", "TRIG", "CHOLHDL", "LDLDIRECT" in the last 72 hours.  Thyroid Function Tests: No results for input(s): "TSH", "T4TOTAL", "FREET4", "T3FREE", "THYROIDAB" in the last 72 hours.  Anemia Panel: No results for input(s): "VITAMINB12", "FOLATE", "FERRITIN", "TIBC", "IRON", "RETICCTPCT" in the last 72 hours.  Urine analysis: No results found for: "COLORURINE", "APPEARANCEUR", "LABSPEC", "PHURINE", "GLUCOSEU", "HGBUR", "BILIRUBINUR", "KETONESUR", "PROTEINUR", "UROBILINOGEN", "NITRITE", "LEUKOCYTESUR"  Sepsis Labs: Lactic Acid, Venous No results found for: "LATICACIDVEN"  MICROBIOLOGY: Recent Results (from the past 240 hours)  Resp panel by RT-PCR (RSV, Flu A&B, Covid) Anterior Nasal Swab     Status: Abnormal   Collection Time: 02/10/23  7:37 PM   Specimen: Anterior Nasal Swab  Result Value Ref Range Status   SARS Coronavirus 2 by RT PCR NEGATIVE NEGATIVE Final   Influenza A by PCR NEGATIVE NEGATIVE Final   Influenza B by PCR NEGATIVE NEGATIVE Final    Comment: (NOTE) The Xpert Xpress SARS-CoV-2/FLU/RSV plus assay is intended as an aid in the diagnosis of influenza from Nasopharyngeal swab  specimens and should not be used as a sole basis for treatment. Nasal washings and aspirates are unacceptable for Xpert Xpress SARS-CoV-2/FLU/RSV testing.  Fact Sheet for Patients: BloggerCourse.com  Fact Sheet for Healthcare Providers: SeriousBroker.it  This test is not yet approved or cleared by the [place]  FDA and has been authorized for detection and/or diagnosis of SARS-CoV-2 by FDA under an Emergency Use Authorization (EUA). This EUA will remain in effect (meaning this test can be used) for the duration of the COVID-19 declaration under Section 564(b)(1) of the Act, 21 U.S.C. section 360bbb-3(b)(1), unless the authorization is terminated or revoked.     Resp Syncytial Virus by PCR POSITIVE (A) NEGATIVE Final    Comment: (NOTE) Fact Sheet for Patients: BloggerCourse.com  Fact Sheet for Healthcare Providers: SeriousBroker.it  This test is not yet approved or cleared by the [place]  FDA and has been authorized for detection and/or diagnosis of SARS-CoV-2 by FDA under an  Emergency Use Authorization (EUA). This EUA will remain in effect (meaning this test can be used) for the duration of the COVID-19 declaration under Section 564(b)(1) of the Act, 21 U.S.C. section 360bbb-3(b)(1), unless the authorization is terminated or revoked.  Performed at [facility], [address] [place], [place] [address]     RADIOLOGY STUDIES/RESULTS: DG Chest 2 View Result Date: 02/10/2023 CLINICAL DATA:  Chest pain with shortness of breath, cough and fever. EXAM: CHEST - 2 VIEW COMPARISON:  None Available. FINDINGS: The heart size and mediastinal contours are within normal limits. Mild linear atelectasis is suspected within the mid right lung. There is no evidence of focal consolidation, pleural effusion or pneumothorax. The visualized skeletal structures are unremarkable.  IMPRESSION: Mild mid right lung linear atelectasis. Electronically Signed   By: Virgle Grime M.D.   On: 02/10/2023 20:58     LOS: 2 days   Kimberly Penna, MD  [facility]    To contact the attending provider between 7A-7P or the covering provider during after hours 7P-7A, please log into the web site [email or web address] and access using universal [place] password for that web site. If you do not have the password, please call the hospital operator.  02/12/2023, 10:32 AM

## 2023-02-12 NOTE — Progress Notes (Signed)
   02/12/23 1118  TOC Brief Assessment  Insurance and Status Reviewed Administrator [employer] Q[employer])  Patient has primary care physician Yes Jason Owens is listed in Care Everywhere  [place], [place]   NPI: [id number]   [address]   [place], [place] [address]   [phone] (Work)   [phone] (Fax)   PCP - General)  Home environment has been reviewed From Home  Prior level of function: Independent  Prior/Current Home Services No current home services  Social Drivers of Health Review SDOH reviewed no interventions necessary  Readmission risk has been reviewed Yes (7%)  Transition of care needs transition of care needs identified, TOC will continue to follow (Assisting with CPAP)   RNCM has been asked to assist with CPAP  Patient has home CPAP but apparently has not been compliant in over a year. TOC will continue to follow patient for any additional discharge needs

## 2023-02-13 ENCOUNTER — Other Ambulatory Visit (HOSPITAL_COMMUNITY): Payer: Self-pay

## 2023-02-13 DIAGNOSIS — G4733 Obstructive sleep apnea (adult) (pediatric): Secondary | ICD-10-CM | POA: Diagnosis not present

## 2023-02-13 DIAGNOSIS — J9601 Acute respiratory failure with hypoxia: Secondary | ICD-10-CM | POA: Diagnosis not present

## 2023-02-13 DIAGNOSIS — J45901 Unspecified asthma with (acute) exacerbation: Secondary | ICD-10-CM | POA: Diagnosis not present

## 2023-02-13 LAB — BASIC METABOLIC PANEL
Anion gap: 11 (ref 5–15)
BUN: 22 mg/dL — ABNORMAL HIGH (ref 6–20)
CO2: 26 mmol/L (ref 22–32)
Calcium: 8.9 mg/dL (ref 8.9–10.3)
Chloride: 98 mmol/L (ref 98–111)
Creatinine, Ser: 0.94 mg/dL (ref 0.61–1.24)
GFR, Estimated: >60 mL/min (ref >60)
Glucose, Bld: 105 mg/dL — ABNORMAL HIGH (ref 70–99)
Potassium: 4.5 mmol/L (ref 3.5–5.1)
Sodium: 135 mmol/L (ref 135–145)

## 2023-02-13 MED ORDER — PREDNISONE 10 MG PO TABS
ORAL_TABLET | ORAL | 0 refills | Status: AC
Start: 2023-02-13 — End: 2023-02-17
  Filled 2023-02-13: qty 10, 4d supply, fill #0

## 2023-02-13 MED ORDER — ALBUTEROL SULFATE HFA 108 (90 BASE) MCG/ACT IN AERS
2.0000 | INHALATION_SPRAY | RESPIRATORY_TRACT | 0 refills | Status: AC | PRN
  Filled 2023-02-13: qty 6.7, 17d supply, fill #0

## 2023-02-13 MED ORDER — POTASSIUM CHLORIDE 10 MEQ/100ML IV SOLN
INTRAVENOUS | Status: AC
  Filled 2023-02-13: qty 100

## 2023-02-13 MED ORDER — BENZONATATE 100 MG PO CAPS
100.0000 mg | ORAL_CAPSULE | Freq: Three times a day (TID) | ORAL | 0 refills | Status: AC | PRN
Start: 2023-02-13 — End: 2024-02-13
  Filled 2023-02-13: qty 30, 10d supply, fill #0

## 2023-02-13 MED ORDER — BUDESONIDE-FORMOTEROL FUMARATE 160-4.5 MCG/ACT IN AERO
2.0000 | INHALATION_SPRAY | Freq: Two times a day (BID) | RESPIRATORY_TRACT | 3 refills | Status: AC
  Filled 2023-02-13: qty 10.2, 30d supply, fill #0

## 2023-02-13 NOTE — TOC Transition Note (Addendum)
Transition of Care [facility]) - Discharge Note   Patient Details  Name: Jason Owens MRN: [id number] Date of Birth: 04-27-1985  Transition of Care [facility] CM/SW Contact:  Gordy Clement, RN Phone Number: 02/13/2023, 12:57 PM   Clinical Narrative:    Update: 1:50 PM  [employer] able to contact patient before DC. Patient told them CPAP is in storage and that he will get it and contact them to troubleshoot functionality.   Patient prefers to arrange own PCP due to the fact that he may relocate to another area  Patient will DC to home today.  CPAP at home has been provided by [employer] and they are reaching out to patient to follow up. No additional TOC needs  Patient states a friend will be here to pick up this afternoon            Patient Goals and CMS Choice            Discharge Placement                       Discharge Plan and Services Additional resources added to the After Visit Summary for                                       Social Drivers of Health (SDOH) Interventions SDOH Screenings   Food Insecurity: No Food Insecurity (02/11/2023)  Housing: Low Risk  (02/11/2023)  Transportation Needs: No Transportation Needs (02/11/2023)  Utilities: Not At Risk (02/11/2023)  Social Connections: Socially Isolated (02/11/2023)  Tobacco Use: Low Risk  (02/11/2023)     Readmission Risk Interventions     No data to display

## 2023-02-13 NOTE — Progress Notes (Signed)
Feels much better Titrated to room air today He thinks he can go home today and is actually requesting for discharge Spoke with case manager-Lynne Norris-Home health agency will deliver CPAP to his house.  Stable for discharge later today-see discharge summary for details.

## 2023-02-13 NOTE — Discharge Summary (Signed)
PATIENT DETAILS Name: Jason Owens Age: 38 y.o. Sex: male Date of Birth: 09-Mar-1985 MRN: [id number]. Admitting Physician: Anselm Jungling, DO PCP:Pcp, No  Admit Date: 02/10/2023 Discharge date: 02/13/2023  Recommendations for Outpatient Follow-up:  Follow up with PCP in 1-2 weeks Please obtain CMP/CBC in one week  Admitted From:  Home  Disposition: Home   Discharge Condition: good  CODE STATUS:   Code Status: Full Code   Diet recommendation:  Diet Order             Diet general           Diet Heart Room service appropriate? Yes; Fluid consistency: Thin  Diet effective now                    Brief Summary: Patient is a 38 y.o.  male with history of asthma, morbid obesity, OSA-noncompliant with CPAP-presented with shortness of breath-found to have acute hypoxic respiratory failure secondary to asthma exacerbation in the setting of RSV infection.   Significant events: 1/13>> admit to [facility]-hypoxia-on BiPAP/HFNC.   Significant studies: 1/13>> CXR: Mid right lung atelectasis.   Significant microbiology data: 1/13>> COVID/influenza PCR: Negative 1/13>> RSV PCR: Positive   Procedures: None   Consults: None  Brief Hospital Course: Acute hypoxic respiratory failure Secondary to asthma exacerbation/RSV infection Much better-transition to room air today-he was managed with steroids/bronchodilators and other supportive care.    Asthma exacerbation secondary to RSV infection Currently better this morning-was managed with steroids/bronchodilators He feels that he is back to his baseline-on room air-and is requesting discharge Will continue with prednisone taper on discharge-will continue to use his albuterol inhaler for rescue-will add inhaled steroid/LABA inhaler to his regimen.   OSA Noncompliant to CPAP previously Placed on CPAP during this hospitalization Case manager evaluation completed-Per my discussion-Home health agency will arrange for CPAP to be  delivered at home.   Hyperkalemia Resolved after Lokelma.   Leukocytosis Likely due to steroids-no indication of any other bacterial infection-clinically improved-repeat CBC in 1 week at PCPs office.   Morbid Obesity: Consult regard importance of weight loss Estimated body mass index is 46.74 kg/m as calculated from the following:   Height as of this encounter: 5\' 11"  (1.803 m).   Weight as of this encounter: 152 kg.    Discharge Diagnoses:  Principal Problem:   Acute hypoxic respiratory failure (HCC) Active Problems:   Asthma, chronic, unspecified asthma severity, with acute exacerbation   Obesity, Class III, BMI 40-49.9 (morbid obesity) (HCC)   RSV (respiratory syncytial virus pneumonia)   OSA (obstructive sleep apnea)   Discharge Instructions:  Activity:  As tolerated  Discharge Instructions     Call MD for:  difficulty breathing, headache or visual disturbances   Complete by: As directed    Diet general   Complete by: As directed    Discharge instructions   Complete by: As directed    Follow with Primary MD in 1-2 weeks  Please take your inhaler regimen as prescribed-follow-up with your primary care practitioner  Home health agency will be delivering CPAP to your house-if you do not hear from them-please contact them.  Please get a complete blood count and chemistry panel checked by your Primary MD at your next visit, and again as instructed by your Primary MD.  Get Medicines reviewed and adjusted: Please take all your medications with you for your next visit with your Primary MD  Laboratory/radiological data: Please request your Primary MD to go over all  hospital tests and procedure/radiological results at the follow up, please ask your Primary MD to get all Hospital records sent to his/her office.  In some cases, they will be blood work, cultures and biopsy results pending at the time of your discharge. Please request that your primary care M.D. follows up on  these results.  Also Note the following: If you experience worsening of your admission symptoms, develop shortness of breath, life threatening emergency, suicidal or homicidal thoughts you must seek medical attention immediately by calling 911 or calling your MD immediately  if symptoms less severe.  You must read complete instructions/literature along with all the possible adverse reactions/side effects for all the Medicines you take and that have been prescribed to you. Take any new Medicines after you have completely understood and accpet all the possible adverse reactions/side effects.   Do not drive when taking Pain medications or sleeping medications (Benzodaizepines)  Do not take more than prescribed Pain, Sleep and Anxiety Medications. It is not advisable to combine anxiety,sleep and pain medications without talking with your primary care practitioner  Special Instructions: If you have smoked or chewed Tobacco  in the last 2 yrs please stop smoking, stop any regular Alcohol  and or any Recreational drug use.  Wear Seat belts while driving.  Please note: You were cared for by a hospitalist during your hospital stay. Once you are discharged, your primary care physician will handle any further medical issues. Please note that NO REFILLS for any discharge medications will be authorized once you are discharged, as it is imperative that you return to your primary care physician (or establish a relationship with a primary care physician if you do not have one) for your post hospital discharge needs so that they can reassess your need for medications and monitor your lab values.   Increase activity slowly   Complete by: As directed       Allergies as of 02/13/2023       Reactions   Penicillins Rash        Medication List     TAKE these medications    acetaminophen 500 MG tablet Commonly known as: TYLENOL Take 1,000 mg by mouth every 6 (six) hours as needed for mild pain (pain  score 1-3) or headache.   albuterol 108 (90 Base) MCG/ACT inhaler Commonly known as: VENTOLIN HFA Inhale 2 puffs into the lungs every 4 (four) hours as needed. For shortness of breath. What changed: when to take this   benzonatate 100 MG capsule Commonly known as: Tessalon Perles Take 1 capsule (100 mg total) by mouth 3 (three) times daily as needed for cough.   budesonide-formoterol 160-4.5 MCG/ACT inhaler Commonly known as: Symbicort Inhale 2 puffs into the lungs 2 (two) times daily.   ibuprofen 200 MG tablet Commonly known as: ADVIL Take 400 mg by mouth every 6 (six) hours as needed for headache or mild pain (pain score 1-3).   predniSONE 10 MG tablet Commonly known as: DELTASONE Take 40 mg daily for 1 day, 30 mg daily for 1 day, 20 mg daily for 1 days,10 mg daily for 1 day, then stop   THERAFLU FLU/COLD/COUGH PO Take 1 packet by mouth as needed. Combination pack for day and nighttime therapy        Follow-up Information     Primary care practitioner. Schedule an appointment as soon as possible for a visit in 1 week(s).          [facility].  Schedule an appointment as soon as possible for a visit in 2 week(s).   Contact information: [address] [place] [address]               Allergies  Allergen Reactions   Penicillins Rash     Other Procedures/Studies: DG Chest 2 View Result Date: 02/10/2023 CLINICAL DATA:  Chest pain with shortness of breath, cough and fever. EXAM: CHEST - 2 VIEW COMPARISON:  None Available. FINDINGS: The heart size and mediastinal contours are within normal limits. Mild linear atelectasis is suspected within the mid right lung. There is no evidence of focal consolidation, pleural effusion or pneumothorax. The visualized skeletal structures are unremarkable. IMPRESSION: Mild mid right lung linear atelectasis. Electronically Signed   By: Aram Candela M.D.   On: 02/10/2023  20:58     TODAY-DAY OF DISCHARGE:  Subjective:   Einar Pheasant today has no headache,no chest abdominal pain,no new weakness tingling or numbness, feels much better wants to go home today.   Objective:   Blood pressure 118/67, pulse 99, temperature 98.6 F (37 C), temperature source Oral, resp. rate 19, height 5\' 11"  (1.803 m), weight (!) 152 kg, SpO2 91%.  Intake/Output Summary (Last 24 hours) at 02/13/2023 1210 Last data filed at 02/13/2023 1153 Gross per 24 hour  Intake 720 ml  Output 875 ml  Net -155 ml   Filed Weights   02/10/23 1914  Weight: (!) 152 kg    Exam: Awake Alert, Oriented *3, No new F.N deficits, Normal affect [place].AT,PERRAL Supple Neck,No JVD, No cervical lymphadenopathy appriciated.  Symmetrical Chest wall movement, Good air movement bilaterally, CTAB RRR,No Gallops,Rubs or new Murmurs, No Parasternal Heave +ve B.Sounds, Abd Soft, Non tender, No organomegaly appriciated, No rebound -guarding or rigidity. No Cyanosis, Clubbing or edema, No new Rash or bruise   PERTINENT RADIOLOGIC STUDIES: No results found.   PERTINENT LAB RESULTS: CBC: Recent Labs    02/11/23 0217 02/12/23 0504  WBC 17.0* 25.3*  HGB 15.3 14.8  HCT 47.2 46.8  PLT 258 274   CMET CMP     Component Value Date/Time   NA 135 02/13/2023 0522   K 4.5 02/13/2023 0522   CL 98 02/13/2023 0522   CO2 26 02/13/2023 0522   GLUCOSE 105 (H) 02/13/2023 0522   BUN 22 (H) 02/13/2023 0522   CREATININE 0.94 02/13/2023 0522   CALCIUM 8.9 02/13/2023 0522   GFRNONAA >60 02/13/2023 0522    GFR Estimated Creatinine Clearance: 158.2 mL/min (by C-G formula based on SCr of 0.94 mg/dL). No results for input(s): "LIPASE", "AMYLASE" in the last 72 hours. No results for input(s): "CKTOTAL", "CKMB", "CKMBINDEX", "TROPONINI" in the last 72 hours. Invalid input(s): "POCBNP" No results for input(s): "DDIMER" in the last 72 hours. No results for input(s): "HGBA1C" in the last 72 hours. No results  for input(s): "CHOL", "HDL", "LDLCALC", "TRIG", "CHOLHDL", "LDLDIRECT" in the last 72 hours. No results for input(s): "TSH", "T4TOTAL", "T3FREE", "THYROIDAB" in the last 72 hours.  Invalid input(s): "FREET3" No results for input(s): "VITAMINB12", "FOLATE", "FERRITIN", "TIBC", "IRON", "RETICCTPCT" in the last 72 hours. Coags: No results for input(s): "INR" in the last 72 hours.  Invalid input(s): "PT" Microbiology: Recent Results (from the past 240 hours)  Resp panel by RT-PCR (RSV, Flu A&B, Covid) Anterior Nasal Swab     Status: Abnormal   Collection Time: 02/10/23  7:37 PM   Specimen: Anterior Nasal Swab  Result Value Ref Range Status   SARS Coronavirus  2 by RT PCR NEGATIVE NEGATIVE Final   Influenza A by PCR NEGATIVE NEGATIVE Final   Influenza B by PCR NEGATIVE NEGATIVE Final    Comment: (NOTE) The Xpert Xpress SARS-CoV-2/FLU/RSV plus assay is intended as an aid in the diagnosis of influenza from Nasopharyngeal swab specimens and should not be used as a sole basis for treatment. Nasal washings and aspirates are unacceptable for Xpert Xpress SARS-CoV-2/FLU/RSV testing.  Fact Sheet for Patients: BloggerCourse.com  Fact Sheet for Healthcare Providers: SeriousBroker.it  This test is not yet approved or cleared by the [place] FDA and has been authorized for detection and/or diagnosis of SARS-CoV-2 by FDA under an Emergency Use Authorization (EUA). This EUA will remain in effect (meaning this test can be used) for the duration of the COVID-19 declaration under Section 564(b)(1) of the Act, 21 U.S.C. section 360bbb-3(b)(1), unless the authorization is terminated or revoked.     Resp Syncytial Virus by PCR POSITIVE (A) NEGATIVE Final    Comment: (NOTE) Fact Sheet for Patients: BloggerCourse.com  Fact Sheet for Healthcare Providers: SeriousBroker.it  This test is not yet  approved or cleared by the [place] FDA and has been authorized for detection and/or diagnosis of SARS-CoV-2 by FDA under an Emergency Use Authorization (EUA). This EUA will remain in effect (meaning this test can be used) for the duration of the COVID-19 declaration under Section 564(b)(1) of the Act, 21 U.S.C. section 360bbb-3(b)(1), unless the authorization is terminated or revoked.  Performed at [facility], [address] [place], [place] [address]     FURTHER DISCHARGE INSTRUCTIONS:  Get Medicines reviewed and adjusted: Please take all your medications with you for your next visit with your Primary MD  Laboratory/radiological data: Please request your Primary MD to go over all hospital tests and procedure/radiological results at the follow up, please ask your Primary MD to get all Hospital records sent to his/her office.  In some cases, they will be blood work, cultures and biopsy results pending at the time of your discharge. Please request that your primary care M.D. goes through all the records of your hospital data and follows up on these results.  Also Note the following: If you experience worsening of your admission symptoms, develop shortness of breath, life threatening emergency, suicidal or homicidal thoughts you must seek medical attention immediately by calling 911 or calling your MD immediately  if symptoms less severe.  You must read complete instructions/literature along with all the possible adverse reactions/side effects for all the Medicines you take and that have been prescribed to you. Take any new Medicines after you have completely understood and accpet all the possible adverse reactions/side effects.   Do not drive when taking Pain medications or sleeping medications (Benzodaizepines)  Do not take more than prescribed Pain, Sleep and Anxiety Medications. It is not advisable to combine anxiety,sleep and pain medications without talking with your  primary care practitioner  Special Instructions: If you have smoked or chewed Tobacco  in the last 2 yrs please stop smoking, stop any regular Alcohol  and or any Recreational drug use.  Wear Seat belts while driving.  Please note: You were cared for by a hospitalist during your hospital stay. Once you are discharged, your primary care physician will handle any further medical issues. Please note that NO REFILLS for any discharge medications will be authorized once you are discharged, as it is imperative that you return to your primary care physician (or establish a relationship with a primary  care physician if you do not have one) for your post hospital discharge needs so that they can reassess your need for medications and monitor your lab values.  Total Time spent coordinating discharge including counseling, education and face to face time equals greater than 30 minutes.  SignedJeoffrey Massed 02/13/2023 12:10 PM

## 2023-10-09 DIAGNOSIS — R300 Dysuria: Secondary | ICD-10-CM | POA: Diagnosis not present

## 2023-10-09 DIAGNOSIS — N5312 Painful ejaculation: Secondary | ICD-10-CM | POA: Diagnosis not present

## 2023-12-29 ENCOUNTER — Ambulatory Visit
Admission: RE | Admit: 2023-12-29 | Discharge: 2023-12-29 | Disposition: A | Discharge status: Home / Self Care | Payer: Self-pay | Source: Ambulatory Visit | Attending: Emergency Medicine | Admitting: Emergency Medicine

## 2023-12-29 VITALS — BP 167/60 | HR 107 | Temp 100.9°F | Resp 18

## 2023-12-29 DIAGNOSIS — J039 Acute tonsillitis, unspecified: Secondary | ICD-10-CM

## 2023-12-29 LAB — POC COVID19/FLU A&B COMBO
Covid Antigen, POC: NEGATIVE
Influenza A Antigen, POC: NEGATIVE
Influenza B Antigen, POC: NEGATIVE

## 2023-12-29 LAB — POCT RAPID STREP A (OFFICE): Rapid Strep A Screen: NEGATIVE

## 2023-12-29 MED ORDER — ACETAMINOPHEN 325 MG PO TABS
975.0000 mg | ORAL_TABLET | Freq: Once | ORAL | Status: AC
  Administered 2023-12-29: 975 mg via ORAL

## 2023-12-29 MED ORDER — AZITHROMYCIN 500 MG PO TABS: 500.0000 mg | ORAL_TABLET | Freq: Every day | ORAL | 0 refills | Status: AC

## 2023-12-29 MED ORDER — ACETAMINOPHEN 500 MG PO TABS: 1000.0000 mg | ORAL_TABLET | Freq: Once | ORAL | Status: DC

## 2023-12-29 NOTE — ED Triage Notes (Signed)
 Pt c/o congestion, sore throat, body aches, fever, cough, diarrhea, since Saturday.

## 2023-12-29 NOTE — ED Provider Notes (Signed)
 [facility]    CSN: [id number] Arrival date & time: 12/29/23  1858      History   Chief Complaint Chief Complaint  Patient presents with   Sore Throat    Entered by patient    HPI Jason Owens is a 38 y.o. male.   38 year old male patient, Jason Owens, presents to urgent care for evaluation of congestion,sore throat,body aches, fever, cough, diarrhea x 2 days. Fiance has strep, + exposure. Pt has hot potato voice, no meds taken. Pt is taking po's.   The history is provided by the patient. No language interpreter was used.    Past Medical History:  Diagnosis Date   Arthritis    Asthma     Patient Active Problem List   Diagnosis Date Noted   Exudative tonsillitis 12/29/2023   Acute hypoxic respiratory failure (HCC) 02/10/2023   Asthma, chronic, unspecified asthma severity, with acute exacerbation 02/10/2023   Obesity, Class III, BMI 40-49.9 (morbid obesity) (HCC) 02/10/2023   RSV (respiratory syncytial virus pneumonia) 02/10/2023   OSA (obstructive sleep apnea) 02/10/2023    History reviewed. No pertinent surgical history.     Home Medications    Prior to Admission medications   Medication Sig Start Date End Date Taking? Authorizing Provider  azithromycin (ZITHROMAX) 500 MG tablet Take 1 tablet (500 mg total) by mouth daily for 5 days. 12/29/23 01/03/24 Yes Karianne Nogueira, NP  acetaminophen  (TYLENOL ) 500 MG tablet Take 1,000 mg by mouth every 6 (six) hours as needed for mild pain (pain score 1-3) or headache.    [provider]  albuterol  (VENTOLIN  HFA) 108 (90 Base) MCG/ACT inhaler Inhale 2 puffs into the lungs every 4 (four) hours as needed for shortness of breath. 02/13/23   Ghimire, Donalda HERO, MD  benzonatate  (TESSALON  PERLES) 100 MG capsule Take 1 capsule (100 mg total) by mouth 3 (three) times daily as needed for cough. 02/13/23 02/13/24  Ghimire, Donalda HERO, MD  budesonide -formoterol  (SYMBICORT ) 160-4.5 MCG/ACT inhaler Inhale 2  puffs into the lungs 2 (two) times daily. 02/13/23 02/13/24  Ghimire, Donalda HERO, MD  ibuprofen (ADVIL) 200 MG tablet Take 400 mg by mouth every 6 (six) hours as needed for headache or mild pain (pain score 1-3).    [provider]  Pseudoeph-CPM-DM-APAP (THERAFLU FLU/COLD/COUGH PO) Take 1 packet by mouth as needed. Combination pack for day and nighttime therapy Patient not taking: Reported on 12/29/2023    [provider]    Family History History reviewed. No pertinent family history.  Social History Social History   Tobacco Use   Smoking status: Never   Smokeless tobacco: Never  Vaping Use   Vaping status: Never Used  Substance Use Topics   Alcohol use: Yes     Allergies   Penicillins   Review of Systems Review of Systems  Constitutional:  Positive for fatigue.  HENT:  Positive for congestion and sore throat.   Respiratory:  Positive for cough.   Gastrointestinal:  Positive for diarrhea.  Musculoskeletal:  Positive for myalgias.  All other systems reviewed and are negative.    Physical Exam Triage Vital Signs ED Triage Vitals  Encounter Vitals Group     BP 12/29/23 1923 (!) 167/60     Girls Systolic BP Percentile --      Girls Diastolic BP Percentile --      Boys Systolic BP Percentile --      Boys Diastolic BP Percentile --      Pulse Rate 12/29/23  1923 (!) 107     Resp 12/29/23 1923 18     Temp 12/29/23 1923 (!) 100.9 F (38.3 C)     Temp Source 12/29/23 1923 Oral     SpO2 12/29/23 1923 93 %     Weight --      Height --      Head Circumference --      Peak Flow --      Pain Score 12/29/23 1926 9     Pain Loc --      Pain Education --      Exclude from Growth Chart --    No data found.  Updated Vital Signs BP (!) 167/60 (BP Location: Right Arm)   Pulse (!) 107   Temp (!) 100.9 F (38.3 C) (Oral)   Resp 18   SpO2 93%   Visual Acuity Right Eye Distance:   Left Eye Distance:   Bilateral Distance:    Right Eye Near:   Left Eye  Near:    Bilateral Near:     Physical Exam Vitals and nursing note reviewed.  Constitutional:      General: He is not in acute distress.    Appearance: He is well-developed. He is not ill-appearing or toxic-appearing.  HENT:     Head: Normocephalic.     Right Ear: Tympanic membrane is retracted.     Left Ear: Tympanic membrane is retracted.     Nose: Mucosal edema and congestion present.     Mouth/Throat:     Mouth: Mucous membranes are moist.     Pharynx: Uvula midline.  Eyes:     General: Lids are normal.     Conjunctiva/sclera: Conjunctivae normal.     Pupils: Pupils are equal, round, and reactive to light.  Cardiovascular:     Rate and Rhythm: Normal rate and regular rhythm.     Heart sounds: Normal heart sounds.  Pulmonary:     Effort: Pulmonary effort is normal. No respiratory distress.     Breath sounds: Normal breath sounds and air entry. No decreased breath sounds or wheezing.  Abdominal:     General: There is no distension.     Palpations: Abdomen is soft.  Musculoskeletal:        General: Normal range of motion.     Cervical back: Normal range of motion.  Skin:    General: Skin is warm and dry.     Findings: No rash.  Neurological:     General: No focal deficit present.     Mental Status: He is alert and oriented to person, place, and time.     GCS: GCS eye subscore is 4. GCS verbal subscore is 5. GCS motor subscore is 6.     Cranial Nerves: No cranial nerve deficit.     Sensory: No sensory deficit.  Psychiatric:        Attention and Perception: Attention normal.        Mood and Affect: Mood normal.        Speech: Speech normal.        Behavior: Behavior normal. Behavior is cooperative.      UC Treatments / Results  Labs (all labs ordered are listed, but only abnormal results are displayed) Labs Reviewed  POC COVID19/FLU A&B COMBO - Normal  POCT RAPID STREP A (OFFICE) - Normal    EKG   Radiology No results found.  Procedures Procedures  (including critical care time)  Medications Ordered in UC Medications  acetaminophen  (TYLENOL )  tablet 975 mg (975 mg Oral Given 12/29/23 2001)    Initial Impression / Assessment and Plan / UC Course  I have reviewed the triage vital signs and the nursing notes.  Pertinent labs & imaging results that were available during my care of the patient were reviewed by me and considered in my medical decision making (see chart for details).  Clinical Course as of 12/29/23 2053  Mon Dec 29, 2023  1946 Strep negative, flu/covid pending [JD]  1947 Flu/covid negative [JD]    Clinical Course User Index [JD] Jeanise Durfey, Rilla, NP   Discussed exam findings and plan of care with patient, will prescription azithromycin for exudative tonsillitis as patient has anaphylaxis with penicillin , strict go to ER precautions given.  Push fluids, alternate Tylenol / ibuprofen as label directed for symptom management , patient verbalized understanding to this provider.  Ddx: Exudative tonsillitis, viral illness, allergies Final Clinical Impressions(s) / UC Diagnoses   Final diagnoses:  Exudative tonsillitis     Discharge Instructions      Take azithromycin as directed, may alternate Tylenol  ibuprofen as label directed for fever/ discomfort.  If you are unable to keep your medications down, have worsening symptoms, difficulty swallowing or breathing, unable to take p.o.'s-please go to the ER for further evaluation.  Throw toothbrush away after 24 hours, will need a new one.      ED Prescriptions     Medication Sig Dispense Auth. Provider   azithromycin (ZITHROMAX) 500 MG tablet Take 1 tablet (500 mg total) by mouth daily for 5 days. 5 tablet Wanza Szumski, Rilla, NP      PDMP not reviewed this encounter.   Aminta Rilla, NP 12/29/23 2053

## 2023-12-29 NOTE — Discharge Instructions (Addendum)
 Take azithromycin as directed, may alternate Tylenol  ibuprofen as label directed for fever/ discomfort.  If you are unable to keep your medications down, have worsening symptoms, difficulty swallowing or breathing, unable to take p.o.'s-please go to the ER for further evaluation.  Throw toothbrush away after 24 hours, will need a new one.

## 2024-01-19 ENCOUNTER — Emergency Department (HOSPITAL_COMMUNITY)
Admission: EM | Admit: 2024-01-19 | Discharge: 2024-01-19 | Disposition: A | Discharge status: Home / Self Care | Attending: Emergency Medicine | Admitting: Emergency Medicine

## 2024-01-19 ENCOUNTER — Other Ambulatory Visit: Payer: Self-pay

## 2024-01-19 ENCOUNTER — Encounter (HOSPITAL_COMMUNITY): Payer: Self-pay

## 2024-01-19 ENCOUNTER — Emergency Department (HOSPITAL_COMMUNITY)

## 2024-01-19 DIAGNOSIS — J101 Influenza due to other identified influenza virus with other respiratory manifestations: Secondary | ICD-10-CM | POA: Diagnosis not present

## 2024-01-19 DIAGNOSIS — J45901 Unspecified asthma with (acute) exacerbation: Secondary | ICD-10-CM

## 2024-01-19 DIAGNOSIS — J4531 Mild persistent asthma with (acute) exacerbation: Secondary | ICD-10-CM | POA: Diagnosis not present

## 2024-01-19 DIAGNOSIS — Z7951 Long term (current) use of inhaled steroids: Secondary | ICD-10-CM | POA: Insufficient documentation

## 2024-01-19 DIAGNOSIS — R059 Cough, unspecified: Secondary | ICD-10-CM | POA: Diagnosis present

## 2024-01-19 LAB — CBC
HCT: 44.4 % (ref 39.0–52.0)
Hemoglobin: 14.1 g/dL (ref 13.0–17.0)
MCH: 29.3 pg (ref 26.0–34.0)
MCHC: 31.8 g/dL (ref 30.0–36.0)
MCV: 92.1 fL (ref 80.0–100.0)
Platelets: 328 K/uL (ref 150–400)
RBC: 4.82 MIL/uL (ref 4.22–5.81)
RDW: 13.2 % (ref 11.5–15.5)
WBC: 9 K/uL (ref 4.0–10.5)
nRBC: 0 % (ref 0.0–0.2)

## 2024-01-19 LAB — BASIC METABOLIC PANEL WITH GFR
Anion gap: 9 (ref 5–15)
BUN: 10 mg/dL (ref 6–20)
CO2: 32 mmol/L (ref 22–32)
Calcium: 9.6 mg/dL (ref 8.9–10.3)
Chloride: 96 mmol/L — ABNORMAL LOW (ref 98–111)
Creatinine, Ser: 0.94 mg/dL (ref 0.61–1.24)
GFR, Estimated: >60 mL/min
Glucose, Bld: 92 mg/dL (ref 70–99)
Potassium: 4.1 mmol/L (ref 3.5–5.1)
Sodium: 137 mmol/L (ref 135–145)

## 2024-01-19 LAB — RESP PANEL BY RT-PCR (RSV, FLU A&B, COVID)  RVPGX2
Influenza A by PCR: POSITIVE — AB
Influenza B by PCR: NEGATIVE
Resp Syncytial Virus by PCR: NEGATIVE
SARS Coronavirus 2 by RT PCR: NEGATIVE

## 2024-01-19 LAB — TROPONIN T, HIGH SENSITIVITY
Troponin T High Sensitivity: <15 ng/L (ref 0–19)
Troponin T High Sensitivity: <15 ng/L (ref 0–19)

## 2024-01-19 MED ORDER — PREDNISONE 50 MG PO TABS: 50.0000 mg | ORAL_TABLET | Freq: Every day | ORAL | 0 refills | Status: AC

## 2024-01-19 MED ORDER — ACETAMINOPHEN 325 MG PO TABS
975.0000 mg | ORAL_TABLET | Freq: Once | ORAL | Status: AC
  Administered 2024-01-19: 975 mg via ORAL
  Filled 2024-01-19: qty 3

## 2024-01-19 MED ORDER — IBUPROFEN 400 MG PO TABS
600.0000 mg | ORAL_TABLET | Freq: Once | ORAL | Status: AC
  Administered 2024-01-19: 600 mg via ORAL
  Filled 2024-01-19: qty 1

## 2024-01-19 MED ORDER — IPRATROPIUM-ALBUTEROL 0.5-2.5 (3) MG/3ML IN SOLN
3.0000 mL | Freq: Once | RESPIRATORY_TRACT | Status: AC
  Administered 2024-01-19: 3 mL via RESPIRATORY_TRACT
  Filled 2024-01-19: qty 3

## 2024-01-19 MED ORDER — PREDNISONE 20 MG PO TABS
60.0000 mg | ORAL_TABLET | Freq: Once | ORAL | Status: AC
  Administered 2024-01-19: 60 mg via ORAL
  Filled 2024-01-19: qty 3

## 2024-01-19 NOTE — ED Provider Triage Note (Signed)
 Emergency Medicine Provider Triage Evaluation Note  Jason Owens , a 38 y.o. male  was evaluated in triage.  Pt complains of shortness of breath which began last night, underlying history of asthma, does not have his nebulizer treatment available.  Did try his albuterol  inhaler, relaxation, tea without any improvement in symptoms..  Review of Systems  Positive: Sob, chest pain Negative: fever  Physical Exam  BP (!) 138/90 (BP Location: Right Arm)   Pulse (!) 131   Temp 98 F (36.7 C) (Oral)   Resp (!) 23   SpO2 94%  Gen:   Awake, no distress   Resp:  Normal effort  MSK:   Moves extremities without difficulty  Other:  Lungs are diminished to auscultation.  Medical Decision Making  Medically screening exam initiated at 2:58 PM.  Appropriate orders placed.  Jason Owens was informed that the remainder of the evaluation will be completed by another provider, this initial triage assessment does not replace that evaluation, and the importance of remaining in the ED until their evaluation is complete.     Jason Heggs, Jason Owens 01/19/24 1459

## 2024-01-19 NOTE — ED Notes (Signed)
 Pt also states he is having chest pain on inspiration, tachycardic in 130's in triage. Pt has hx of asthma and took albuterol  inhaler upon arrival.

## 2024-01-19 NOTE — ED Triage Notes (Signed)
 Patient in ED today with complaints of congestion since yesterday. Patient states he does feel like he has chills, shob and cough for a while yesterday. Denies sore throat denies fever.

## 2024-01-19 NOTE — ED Provider Notes (Signed)
 " [place] EMERGENCY DEPARTMENT AT [facility] Provider Note   CSN: [id number] Arrival date & time: 01/19/24  1436     Patient presents with: URI and Chest Pain   Jason Owens is a 38 y.o. male.  With a history of asthma who presents to the ED for shortness of breath.  Cough congestion shortness of breath began last night persisted since the onset.  No fevers or GI symptoms.  Albuterol  at home provided little relief    URI Chest Pain      Prior to Admission medications  Medication Sig Start Date End Date Taking? Authorizing Provider  predniSONE  (DELTASONE ) 50 MG tablet Take 1 tablet (50 mg total) by mouth daily with breakfast for 3 days. 01/19/24 01/22/24 Yes Pamella Ozell LABOR, DO  acetaminophen  (TYLENOL ) 500 MG tablet Take 1,000 mg by mouth every 6 (six) hours as needed for mild pain (pain score 1-3) or headache.    [provider]  albuterol  (VENTOLIN  HFA) 108 (90 Base) MCG/ACT inhaler Inhale 2 puffs into the lungs every 4 (four) hours as needed for shortness of breath. 02/13/23   Ghimire, Donalda HERO, MD  benzonatate  (TESSALON  PERLES) 100 MG capsule Take 1 capsule (100 mg total) by mouth 3 (three) times daily as needed for cough. 02/13/23 02/13/24  Ghimire, Donalda HERO, MD  budesonide -formoterol  (SYMBICORT ) 160-4.5 MCG/ACT inhaler Inhale 2 puffs into the lungs 2 (two) times daily. 02/13/23 02/13/24  Ghimire, Donalda HERO, MD  ibuprofen  (ADVIL ) 200 MG tablet Take 400 mg by mouth every 6 (six) hours as needed for headache or mild pain (pain score 1-3).    [provider]  Pseudoeph-CPM-DM-APAP (THERAFLU FLU/COLD/COUGH PO) Take 1 packet by mouth as needed. Combination pack for day and nighttime therapy Patient not taking: Reported on 12/29/2023    [provider]    Allergies: Penicillins    Review of Systems  Cardiovascular:  Positive for chest pain.    Updated Vital Signs BP 130/81 (BP Location: Right Arm)   Pulse (!) 120   Temp 99.9 F (37.7  C) (Oral)   Resp 16   SpO2 95%   Physical Exam Vitals and nursing note reviewed.  HENT:     Head: Normocephalic and atraumatic.  Eyes:     Pupils: Pupils are equal, round, and reactive to light.  Cardiovascular:     Rate and Rhythm: Normal rate and regular rhythm.  Pulmonary:     Effort: Pulmonary effort is normal.     Breath sounds: Wheezing present.  Abdominal:     Palpations: Abdomen is soft.     Tenderness: There is no abdominal tenderness.  Skin:    General: Skin is warm and dry.  Neurological:     Mental Status: He is alert.  Psychiatric:        Mood and Affect: Mood normal.     (all labs ordered are listed, but only abnormal results are displayed) Labs Reviewed  RESP PANEL BY RT-PCR (RSV, FLU A&B, COVID)  RVPGX2 - Abnormal; Notable for the following components:      Result Value   Influenza A by PCR POSITIVE (*)    All other components within normal limits  BASIC METABOLIC PANEL WITH GFR - Abnormal; Notable for the following components:   Chloride 96 (*)    All other components within normal limits  CBC  TROPONIN T, HIGH SENSITIVITY  TROPONIN T, HIGH SENSITIVITY    EKG: None  Radiology: DG Chest 2 View Result Date:  01/19/2024 CLINICAL DATA:  Chills with cough and shortness of breath. EXAM: CHEST - 2 VIEW COMPARISON:  None Available. FINDINGS: The heart size and mediastinal contours are within normal limits. Both lungs are clear. A radiopaque BB is seen within the superficial soft tissues of the lower posterior chest wall on the right. The visualized skeletal structures are unremarkable. IMPRESSION: No active cardiopulmonary disease. Electronically Signed   By: Suzen Dials M.D.   On: 01/19/2024 16:00     Procedures   Medications Ordered in the ED  predniSONE  (DELTASONE ) tablet 60 mg (has no administration in time range)  acetaminophen  (TYLENOL ) tablet 975 mg (975 mg Oral Given 01/19/24 2142)  ibuprofen  (ADVIL ) tablet 600 mg (600 mg Oral Given  01/19/24 2142)  ipratropium-albuterol  (DUONEB) 0.5-2.5 (3) MG/3ML nebulizer solution 3 mL (3 mLs Nebulization Given 01/19/24 2142)                                    Medical Decision Making 38 year old male with history as above presented to the ED for 24 hours of cough congestion shortness of breath despite albuterol  use.  Influenza A positive.  Some wheezing on my exam.  Feels much better after DuoNeb treatment.  Will provide dose of steroids here and short course of outpatient prednisone  to help with asthma exacerbation in the setting of acute influenza A infection.  Counseled him on symptomatic management for flu symptoms.  Stable for discharge looks well on room air  Amount and/or Complexity of Data Reviewed Labs: ordered. Radiology: ordered.  Risk OTC drugs. Prescription drug management.        Final diagnoses:  Influenza A  Mild asthma with exacerbation, unspecified whether persistent    ED Discharge Orders          Ordered    predniSONE  (DELTASONE ) 50 MG tablet  Daily with breakfast        01/19/24 2305               Pamella Ozell LABOR, DO 01/19/24 2317  "

## 2024-01-19 NOTE — Discharge Instructions (Signed)
 You were seen in the Emergency Department for cough congestion trouble breathing He tested positive for influenza A We gave you a breathing treatment and felt better We also gave you dose of steroids We have called in a prescription for you to pick up from your pharmacy [facility] on [facility] to continue taking steroids for the next 3 days Use your albuterol  at home Return to the emerged from for trouble breathing or any other concerns Take Tylenol  or Motrin  as directed for fevers Follow-up with your primary doctor
